# Patient Record
Sex: Male | Born: 1965 | Race: White | Hispanic: No | Marital: Married | Smoking: Never smoker
Health system: Southern US, Community
[De-identification: ages and names within clinical notes are randomized; demographics above are authoritative.]

## PROBLEM LIST (undated history)

## (undated) DIAGNOSIS — K219 Gastro-esophageal reflux disease without esophagitis: Secondary | ICD-10-CM

## (undated) DIAGNOSIS — G4730 Sleep apnea, unspecified: Secondary | ICD-10-CM

## (undated) DIAGNOSIS — B019 Varicella without complication: Secondary | ICD-10-CM

## (undated) HISTORY — DX: Varicella without complication: B01.9

## (undated) HISTORY — PX: FOOT SURGERY: SHX648

## (undated) HISTORY — DX: Sleep apnea, unspecified: G47.30

## (undated) HISTORY — DX: Gastro-esophageal reflux disease without esophagitis: K21.9

---
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
## 2004-05-18 ENCOUNTER — Ambulatory Visit: Payer: Self-pay | Admitting: Podiatry

## 2004-08-22 ENCOUNTER — Emergency Department: Payer: Self-pay | Admitting: Unknown Physician Specialty

## 2008-01-31 ENCOUNTER — Ambulatory Visit: Payer: Self-pay | Admitting: Cardiology

## 2009-03-02 ENCOUNTER — Emergency Department: Payer: Self-pay

## 2013-06-20 ENCOUNTER — Emergency Department: Payer: Self-pay | Admitting: Emergency Medicine

## 2014-11-27 ENCOUNTER — Encounter: Payer: Self-pay | Admitting: Podiatry

## 2014-11-27 ENCOUNTER — Ambulatory Visit: Payer: Self-pay

## 2014-11-27 ENCOUNTER — Ambulatory Visit (INDEPENDENT_AMBULATORY_CARE_PROVIDER_SITE_OTHER): Payer: 59 | Admitting: Podiatry

## 2014-11-27 VITALS — BP 122/75 | HR 72 | Resp 17 | Ht 70.0 in | Wt 225.0 lb

## 2014-11-27 DIAGNOSIS — M7661 Achilles tendinitis, right leg: Secondary | ICD-10-CM

## 2014-11-27 DIAGNOSIS — M7740 Metatarsalgia, unspecified foot: Secondary | ICD-10-CM

## 2014-11-27 DIAGNOSIS — M79672 Pain in left foot: Secondary | ICD-10-CM

## 2014-11-27 DIAGNOSIS — M7662 Achilles tendinitis, left leg: Secondary | ICD-10-CM

## 2014-11-27 MED ORDER — MELOXICAM 15 MG PO TABS: 15.0000 mg | ORAL_TABLET | Freq: Every day | ORAL | Status: DC

## 2014-11-27 NOTE — Patient Instructions (Signed)
Achilles Tendinitis   with Rehab  Achilles tendinitis is a disorder of the Achilles tendon. The Achilles tendon connects the large calf muscles (Gastrocnemius and Soleus) to the heel bone (calcaneus). This tendon is sometimes called the heel cord. It is important for pushing-off and standing on your toes and is important for walking, running, or jumping. Tendinitis is often caused by overuse and repetitive microtrauma.  SYMPTOMS  · Pain, tenderness, swelling, warmth, and redness may occur over the Achilles tendon even at rest.  · Pain with pushing off, or flexing or extending the ankle.  · Pain that is worsened after or during activity.  CAUSES   · Overuse sometimes seen with rapid increase in exercise programs or in sports requiring running and jumping.  · Poor physical conditioning (strength and flexibility or endurance).  · Running sports, especially training running down hills.  · Inadequate warm-up before practice or play or failure to stretch before participation.  · Injury to the tendon.  PREVENTION   · Warm up and stretch before practice or competition.  · Allow time for adequate rest and recovery between practices and competition.  · Keep up conditioning.  ¨ Keep up ankle and leg flexibility.  ¨ Improve or keep muscle strength and endurance.  ¨ Improve cardiovascular fitness.  · Use proper technique.  · Use proper equipment (shoes, skates).  · To help prevent recurrence, taping, protective strapping, or an adhesive bandage may be recommended for several weeks after healing is complete.  PROGNOSIS   · Recovery may take weeks to several months to heal.  · Longer recovery is expected if symptoms have been prolonged.  · Recovery is usually quicker if the inflammation is due to a direct blow as compared with overuse or sudden strain.  RELATED COMPLICATIONS   · Healing time will be prolonged if the condition is not correctly treated. The injury must be given plenty of time to heal.  · Symptoms can reoccur if  activity is resumed too soon.  · Untreated, tendinitis may increase the risk of tendon rupture requiring additional time for recovery and possibly surgery.  TREATMENT   · The first treatment consists of rest anti-inflammatory medication, and ice to relieve the pain.  · Stretching and strengthening exercises after resolution of pain will likely help reduce the risk of recurrence. Referral to a physical therapist or athletic trainer for further evaluation and treatment may be helpful.  · A walking boot or cast may be recommended to rest the Achilles tendon. This can help break the cycle of inflammation and microtrauma.  · Arch supports (orthotics) may be prescribed or recommended by your caregiver as an adjunct to therapy and rest.  · Surgery to remove the inflamed tendon lining or degenerated tendon tissue is rarely necessary and has shown less than predictable results.  MEDICATION   · Nonsteroidal anti-inflammatory medications, such as aspirin and ibuprofen, may be used for pain and inflammation relief. Do not take within 7 days before surgery. Take these as directed by your caregiver. Contact your caregiver immediately if any bleeding, stomach upset, or signs of allergic reaction occur. Other minor pain relievers, such as acetaminophen, may also be used.  · Pain relievers may be prescribed as necessary by your caregiver. Do not take prescription pain medication for longer than 4 to 7 days. Use only as directed and only as much as you need.  · Cortisone injections are rarely indicated. Cortisone injections may weaken tendons and predispose to rupture. It is better   to give the condition more time to heal than to use them.  HEAT AND COLD  · Cold is used to relieve pain and reduce inflammation for acute and chronic Achilles tendinitis. Cold should be applied for 10 to 15 minutes every 2 to 3 hours for inflammation and pain and immediately after any activity that aggravates your symptoms. Use ice packs or an ice  massage.  · Heat may be used before performing stretching and strengthening activities prescribed by your caregiver. Use a heat pack or a warm soak.  SEEK MEDICAL CARE IF:  · Symptoms get worse or do not improve in 2 weeks despite treatment.  · New, unexplained symptoms develop. Drugs used in treatment may produce side effects.  EXERCISES  RANGE OF MOTION (ROM) AND STRETCHING EXERCISES - Achilles Tendinitis   These exercises may help you when beginning to rehabilitate your injury. Your symptoms may resolve with or without further involvement from your physician, physical therapist or athletic trainer. While completing these exercises, remember:   · Restoring tissue flexibility helps normal motion to return to the joints. This allows healthier, less painful movement and activity.  · An effective stretch should be held for at least 30 seconds.  · A stretch should never be painful. You should only feel a gentle lengthening or release in the stretched tissue.  STRETCH - Gastroc, Standing   · Place hands on wall.  · Extend right / left leg, keeping the front knee somewhat bent.  · Slightly point your toes inward on your back foot.  · Keeping your right / left heel on the floor and your knee straight, shift your weight toward the wall, not allowing your back to arch.  · You should feel a gentle stretch in the right / left calf. Hold this position for __________ seconds.  Repeat __________ times. Complete this stretch __________ times per day.  STRETCH - Soleus, Standing   · Place hands on wall.  · Extend right / left leg, keeping the other knee somewhat bent.  · Slightly point your toes inward on your back foot.  · Keep your right / left heel on the floor, bend your back knee, and slightly shift your weight over the back leg so that you feel a gentle stretch deep in your back calf.  · Hold this position for __________ seconds.  Repeat __________ times. Complete this stretch __________ times per day.  STRETCH -  Gastrocsoleus, Standing   Note: This exercise can place a lot of stress on your foot and ankle. Please complete this exercise only if specifically instructed by your caregiver.   · Place the ball of your right / left foot on a step, keeping your other foot firmly on the same step.  · Hold on to the wall or a rail for balance.  · Slowly lift your other foot, allowing your body weight to press your heel down over the edge of the step.  · You should feel a stretch in your right / left calf.  · Hold this position for __________ seconds.  · Repeat this exercise with a slight bend in your knee.  Repeat __________ times. Complete this stretch __________ times per day.   STRENGTHENING EXERCISES - Achilles Tendinitis  These exercises may help you when beginning to rehabilitate your injury. They may resolve your symptoms with or without further involvement from your physician, physical therapist or athletic trainer. While completing these exercises, remember:   · Muscles can gain both the endurance   and the strength needed for everyday activities through controlled exercises.  · Complete these exercises as instructed by your physician, physical therapist or athletic trainer. Progress the resistance and repetitions only as guided.  · You may experience muscle soreness or fatigue, but the pain or discomfort you are trying to eliminate should never worsen during these exercises. If this pain does worsen, stop and make certain you are following the directions exactly. If the pain is still present after adjustments, discontinue the exercise until you can discuss the trouble with your clinician.  STRENGTH - Plantar-flexors   · Sit with your right / left leg extended. Holding onto both ends of a rubber exercise band/tubing, loop it around the ball of your foot. Keep a slight tension in the band.  · Slowly push your toes away from you, pointing them downward.  · Hold this position for __________ seconds. Return slowly, controlling the  tension in the band/tubing.  Repeat __________ times. Complete this exercise __________ times per day.   STRENGTH - Plantar-flexors   · Stand with your feet shoulder width apart. Steady yourself with a wall or table using as little support as needed.  · Keeping your weight evenly spread over the width of your feet, rise up on your toes.*  · Hold this position for __________ seconds.  Repeat __________ times. Complete this exercise __________ times per day.   *If this is too easy, shift your weight toward your right / left leg until you feel challenged. Ultimately, you may be asked to do this exercise with your right / left foot only.  STRENGTH - Plantar-flexors, Eccentric   Note: This exercise can place a lot of stress on your foot and ankle. Please complete this exercise only if specifically instructed by your caregiver.   · Place the balls of your feet on a step. With your hands, use only enough support from a wall or rail to keep your balance.  · Keep your knees straight and rise up on your toes.  · Slowly shift your weight entirely to your right / left toes and pick up your opposite foot. Gently and with controlled movement, lower your weight through your right / left foot so that your heel drops below the level of the step. You will feel a slight stretch in the back of your calf at the end position.  · Use the healthy leg to help rise up onto the balls of both feet, then lower weight only on the right / left leg again. Build up to 15 repetitions. Then progress to 3 consecutive sets of 15 repetitions.*  · After completing the above exercise, complete the same exercise with a slight knee bend (about 30 degrees). Again, build up to 15 repetitions. Then progress to 3 consecutive sets of 15 repetitions.*  Perform this exercise __________ times per day.   *When you easily complete 3 sets of 15, your physician, physical therapist or athletic trainer may advise you to add resistance by wearing a backpack filled with  additional weight.  STRENGTH - Plantar Flexors, Seated   · Sit on a chair that allows your feet to rest flat on the ground. If necessary, sit at the edge of the chair.  · Keeping your toes firmly on the ground, lift your right / left heel as far as you can without increasing any discomfort in your ankle.  Repeat __________ times. Complete this exercise __________ times a day.  *If instructed by your physician, physical therapist or athletic   trainer, you may add ____________________ of resistance by placing a weighted object on your right / left knee.  Document Released: 12/22/2004 Document Revised: 08/15/2011 Document Reviewed: 09/04/2008  [employer]® Patient Information ©2015 [employer]. This information is not intended to replace advice given to you by your health care provider. Make sure you discuss any questions you have with your health care provider.

## 2014-11-27 NOTE — Progress Notes (Signed)
   Subjective:    Patient ID: Luis Perez, male    DOB: [birth year], 49 y.o.   MRN: [id number]  HPI 49 year old male presents the office today with complaints of bilateral heel pain with a left greater than right. He states the majority of his pain in the back of his heel. He also states that he has painful bulbous foot on the left side. He states that both these issues have been ongoing for several months. He denies any history of injury or trauma. He denies any tingling or numbness. Denies any swelling or redness. The pain does not wake him at night. He states that he has stiffness in the back of his heel particularly in the morning which gets better with ambulation. He has had no prior treatment. No other complaints at this time.   Review of Systems  All other systems reviewed and are negative.      Objective:   Physical Exam AAO 3, NAD  DP/PT pulses palpable, CRT less than 3 seconds Protective sensation intact with Simms Weinstein monofilament, vibratory sensation intact, Achilles tendon reflex intact. There is tenderness palpation along the posterior aspect of bilateral calcaneus mostly overlying the lateral aspect over the area of the retrocalcaneal exostosis. There is also tenderness along the distal aspect of the Achilles tendon on the insertion of the calcaneus. There is no tenderness on the mid substance of the Achilles tendon there is no defect noted. There is no overlying edema, erythema, increase in warmth. [place] Silos test is performed is negative. There is no tenderness on the plantar aspect of the calcaneus along the course of the plantar fascia. No pain with lateral compression the calcaneus or pain with vibratory sensation. Equinus is present. There is mild hyperkeratotic lesions bilateral submetatarsal 2 through 4 bilaterally with mild tenderness along this area. There is no areas of pinpoint bony tenderness or pain the vibratory sensation along the metatarsals or digits. There  is no pain with MTPJ range of motion. No other areas of tenderness bilateral lower extremities there is no other areas of edema, erythema, increase in warmth. MMT 5/5, ROM WNL No open lesions or pre-ulcer lesions identified bilaterally.  No pain on calf compression, swelling, warmth, erythema.      Assessment & Plan:  3 shell male bilateral symptomatically retrocalcaneal exostosis, Achilles tendinitis; metatarsalgia -X-rays were obtained and reviewed with the patient.  -Treatment options discussed including all alternatives, risks, and complications -Discussed etiology of symptoms.  -Stretching exercises to continue on a consistent basis. -Ice to the area. -Dispensed night splint.  -Prescribed mobic. Discussed side effects of the medication and directed to stop if any are to occur and call the office.  -Dispensed metatarsal offloading pads. -Follow-up in 4 weeks or sooner if any problems are to arise. In the meantime I encouraged him to call the office with any questions, concerns, change in symptoms.

## 2014-12-25 ENCOUNTER — Ambulatory Visit (INDEPENDENT_AMBULATORY_CARE_PROVIDER_SITE_OTHER): Payer: 59 | Admitting: Podiatry

## 2014-12-25 VITALS — BP 122/78 | HR 77 | Resp 16

## 2014-12-25 DIAGNOSIS — M2570 Osteophyte, unspecified joint: Secondary | ICD-10-CM

## 2014-12-25 DIAGNOSIS — M7661 Achilles tendinitis, right leg: Secondary | ICD-10-CM

## 2014-12-25 DIAGNOSIS — M898X7 Other specified disorders of bone, ankle and foot: Secondary | ICD-10-CM

## 2014-12-25 DIAGNOSIS — M7662 Achilles tendinitis, left leg: Secondary | ICD-10-CM | POA: Diagnosis not present

## 2014-12-26 ENCOUNTER — Encounter: Payer: Self-pay | Admitting: Podiatry

## 2014-12-26 NOTE — Progress Notes (Signed)
Patient ID: Luis Perez, male   DOB: [birth year], 49 y.o.   MRN: [id number]  Subjective: Luis Perez presents today for follow-up of bilateral achilles tendonitis and symptomatic retrocalcaneal exostosis with the left worse than the right. He continues with icing and stretching daily as well as the night splint. He states the pain is about the same without much noticeable improvement. He has also continued with the mobic without any side effects. Pain is worse in the morning or after being up for some time. No numbness or tingling. No recent injury/trauma.  Denies any systemic complaints such as fevers, chills, nausea, vomiting. No acute changes since last appointment, and no other complaints at this time.   Objective: AAO x3, NAD DP/PT pulses palpable bilaterally, CRT less than 3 seconds Protective sensation intact with Simms Weinstein monofilament, vibratory sensation intact, Achilles tendon reflex intact Tenderness along the posterior aspect of bilateral calcaneous over prominent retrocalcaneal exostosis and at the insertion of the achilles tendon. No pain along the midsubstance of the tendon; no defect; negative Thompson test. No pain along the course/insertion of the plantar fascia. No pain with lateral compression of the calcaneous.  No areas of pinpoint bony tenderness or pain with vibratory sensation. MMT 5/5, ROM WNL. No edema, erythema, increase in warmth to bilateral lower extremities.  No open lesions or pre-ulcerative lesions.  No pain with calf compression, swelling, warmth, erythema  Assessment: Achilles tendonitis/retrocalcaneal exostosis   Plan: -All treatment options discussed with the patient including all alternatives, risks, complications.  -Continue with home exercises; night splint; meloxicam. -Rx physical therapy -Discussed surgery if needed -Sheogear modifications/orthtoics  -Follow-up after PT or sooner if any problems arise. In the meantime, encouraged to call the  office with any questions, concerns, change in symptoms.  -Patient encouraged to call the office with any questions, concerns, change in symptoms.   Celesta Gentile, DPM

## 2014-12-31 ENCOUNTER — Encounter: Payer: Self-pay | Admitting: Internal Medicine

## 2014-12-31 ENCOUNTER — Encounter (INDEPENDENT_AMBULATORY_CARE_PROVIDER_SITE_OTHER): Payer: Self-pay

## 2014-12-31 ENCOUNTER — Ambulatory Visit (INDEPENDENT_AMBULATORY_CARE_PROVIDER_SITE_OTHER): Payer: 59 | Admitting: Internal Medicine

## 2014-12-31 VITALS — BP 130/80 | HR 75 | Temp 98.5°F | Ht 69.5 in | Wt 229.0 lb

## 2014-12-31 DIAGNOSIS — M779 Enthesopathy, unspecified: Secondary | ICD-10-CM | POA: Diagnosis not present

## 2014-12-31 DIAGNOSIS — M7750 Other enthesopathy of unspecified foot: Secondary | ICD-10-CM

## 2014-12-31 LAB — LIPID PANEL
Cholesterol: 202 mg/dL — AB (ref 0–200)
HDL: 54 mg/dL (ref 35–70)
LDL CALC: 117 mg/dL
TRIGLYCERIDES: 157 mg/dL (ref 40–160)

## 2014-12-31 LAB — HEMOGLOBIN A1C: Hgb A1c MFr Bld: 5.3 % (ref 4.0–6.0)

## 2014-12-31 NOTE — Assessment & Plan Note (Signed)
Meloxicam not effective We will see if PT helps If not, he will follow up with [facility] to discuss further treatment

## 2014-12-31 NOTE — Patient Instructions (Signed)

## 2014-12-31 NOTE — Progress Notes (Signed)
HPI  Pt presents to the clinic today to establish care and for management of the conditions listed below. He has not had a PCP in many years. He has no concerns today.  Flu: 03/2014 Tetanus: > 10 years ago Vision: yearly Dentist: biannually  He is having bilateral foot pain.This has been going on for 2-3 months. He is seeing [facility]. He has been diagnosed with bilateral heel spurs. He is taking Meloxicam and starting PT next week. He has not noticed a difference in his heel pain.  Past Medical History  Diagnosis Date  . Chicken pox     Current Outpatient Prescriptions  Medication Sig Dispense Refill  . meloxicam (MOBIC) 15 MG tablet Take 1 tablet (15 mg total) by mouth daily. 30 tablet 2   No current facility-administered medications for this visit.    Allergies  Allergen Reactions  . Allegra-D [Fexofenadine-Pseudoephed Er] Swelling    Swelling of the eyes    Family History  Problem Relation Age of Onset  . Arthritis Mother   . Arthritis Maternal Grandfather   . Cancer Neg Hx   . Diabetes Neg Hx   . Heart disease Neg Hx   . Stroke Neg Hx     History   Social History  . Marital Status: Married    Spouse Name: N/A  . Number of Children: N/A  . Years of Education: N/A   Occupational History  . Not on file.   Social History Main Topics  . Smoking status: Never Smoker   . Smokeless tobacco: Never Used  . Alcohol Use: 10.8 oz/week    0 Standard drinks or equivalent, 18 Cans of beer per week     Comment: moderate  . Drug Use: No  . Sexual Activity: Yes   Other Topics Concern  . Not on file   Social History Narrative    ROS:  Constitutional: Denies fever, malaise, fatigue, headache or abrupt weight changes.  Respiratory: Denies difficulty breathing, shortness of breath, cough or sputum production.   Cardiovascular: Denies chest pain, chest tightness, palpitations or swelling in the hands or feet.  Musculoskeletal: Pt reports bilateral heel pain.  Denies decrease in range of motion, difficulty with gait, muscle pain or joint pain and swelling.  Neurological: Denies dizziness, difficulty with memory, difficulty with speech or problems with balance and coordination.   No other specific complaints in a complete review of systems (except as listed in HPI above).  PE:  BP 130/80 mmHg  Pulse 75  Temp(Src) 98.5 F (36.9 C) (Oral)  Ht 5' 9.5" (1.765 m)  Wt 229 lb (103.874 kg)  BMI 33.34 kg/m2  SpO2 98% Wt Readings from Last 3 Encounters:  12/31/14 229 lb (103.874 kg)  11/27/14 225 lb (102.059 kg)    General: Appears his stated age, obese in NAD. Skin: Warm, dry and intact. Cardiovascular: Normal rate and rhythm. S1,S2 noted.  No murmur, rubs or gallops noted.  Pulmonary/Chest: Normal effort and positive vesicular breath sounds. No respiratory distress. No wheezes, rales or ronchi noted.  Musculoskeletal: Normal gait. Pain with palpation of bilateral heels. Neurological: Alert and oriented. Sensation intact to BLE.   Assessment and Plan:

## 2014-12-31 NOTE — Progress Notes (Signed)
Pre visit review using our clinic review tool, if applicable. No additional management support is needed unless otherwise documented below in the visit note. 

## 2015-02-13 ENCOUNTER — Encounter: Payer: Self-pay | Admitting: Internal Medicine

## 2016-02-10 ENCOUNTER — Encounter: Payer: Self-pay | Admitting: Emergency Medicine

## 2016-02-10 ENCOUNTER — Emergency Department: Payer: 59

## 2016-02-10 ENCOUNTER — Emergency Department
Admission: EM | Admit: 2016-02-10 | Discharge: 2016-02-10 | Disposition: A | Discharge status: Home / Self Care | Payer: 59 | Attending: Emergency Medicine | Admitting: Emergency Medicine

## 2016-02-10 DIAGNOSIS — Y929 Unspecified place or not applicable: Secondary | ICD-10-CM | POA: Diagnosis not present

## 2016-02-10 DIAGNOSIS — R109 Unspecified abdominal pain: Secondary | ICD-10-CM

## 2016-02-10 DIAGNOSIS — X58XXXA Exposure to other specified factors, initial encounter: Secondary | ICD-10-CM | POA: Insufficient documentation

## 2016-02-10 DIAGNOSIS — Y9301 Activity, walking, marching and hiking: Secondary | ICD-10-CM | POA: Diagnosis not present

## 2016-02-10 DIAGNOSIS — S39012A Strain of muscle, fascia and tendon of lower back, initial encounter: Secondary | ICD-10-CM | POA: Diagnosis not present

## 2016-02-10 DIAGNOSIS — S3992XA Unspecified injury of lower back, initial encounter: Secondary | ICD-10-CM | POA: Diagnosis present

## 2016-02-10 DIAGNOSIS — Y999 Unspecified external cause status: Secondary | ICD-10-CM | POA: Insufficient documentation

## 2016-02-10 DIAGNOSIS — Z791 Long term (current) use of non-steroidal anti-inflammatories (NSAID): Secondary | ICD-10-CM | POA: Diagnosis not present

## 2016-02-10 DIAGNOSIS — R1031 Right lower quadrant pain: Secondary | ICD-10-CM | POA: Diagnosis not present

## 2016-02-10 LAB — URINALYSIS COMPLETE WITH MICROSCOPIC (ARMC ONLY)
BACTERIA UA: NONE SEEN
BILIRUBIN URINE: NEGATIVE
Glucose, UA: NEGATIVE mg/dL
Hgb urine dipstick: NEGATIVE
Ketones, ur: NEGATIVE mg/dL
Leukocytes, UA: NEGATIVE
Nitrite: NEGATIVE
PH: 6 (ref 5.0–8.0)
Protein, ur: NEGATIVE mg/dL
SQUAMOUS EPITHELIAL / LPF: NONE SEEN
Specific Gravity, Urine: 1.008 (ref 1.005–1.030)

## 2016-02-10 MED ORDER — KETOROLAC TROMETHAMINE 60 MG/2ML IM SOLN
15.0000 mg | Freq: Once | INTRAMUSCULAR | Status: AC
  Administered 2016-02-10: 15 mg via INTRAMUSCULAR
  Filled 2016-02-10: qty 2

## 2016-02-10 MED ORDER — DIAZEPAM 5 MG PO TABS: 5.0000 mg | ORAL_TABLET | Freq: Three times a day (TID) | ORAL | 0 refills | Status: DC | PRN

## 2016-02-10 MED ORDER — NAPROXEN 500 MG PO TABS: 500.0000 mg | ORAL_TABLET | Freq: Two times a day (BID) | ORAL | 0 refills | Status: DC

## 2016-02-10 NOTE — ED Triage Notes (Signed)
Pt here for right flank pain.  Does report hx of kidney stones.

## 2016-02-10 NOTE — ED Provider Notes (Signed)
[facility] Emergency Department Provider Note  ____________________________________________  Time seen: Approximately 6:51 PM  I have reviewed the triage vital signs and the nursing notes.   HISTORY  Chief Complaint Flank Pain    HPI Luis Perez is a 50 y.o. male who complains of right flank pain for the past 3 days, started suddenly while walking. No slips trips or falls. Nonradiating. No fevers chills or sweats. No vomiting or diarrhea. Eating and drinking normally although decreased appetite with this pain. Pain is been constant, worsening. Worse with movement.     Past Medical History:  Diagnosis Date  . Chicken pox   Kidney stones   Patient Active Problem List   Diagnosis Date Noted  . Bone spur of foot 12/31/2014     Past Surgical History:  Procedure Laterality Date  . TONSILLECTOMY AND ADENOIDECTOMY  1985     Prior to Admission medications   Medication Sig Start Date End Date Taking? Authorizing Provider  diazepam (VALIUM) 5 MG tablet Take 1 tablet (5 mg total) by mouth every 8 (eight) hours as needed for muscle spasms. 02/10/16   Carrie Mew, MD  meloxicam (MOBIC) 15 MG tablet Take 1 tablet (15 mg total) by mouth daily. 11/27/14   Trula Slade, DPM  naproxen (NAPROSYN) 500 MG tablet Take 1 tablet (500 mg total) by mouth 2 (two) times daily with a meal. 02/10/16   Carrie Mew, MD     Allergies Allegra-d [fexofenadine-pseudoephed er]   Family History  Problem Relation Age of Onset  . Arthritis Mother   . Arthritis Maternal Grandfather   . Cancer Neg Hx   . Diabetes Neg Hx   . Heart disease Neg Hx   . Stroke Neg Hx     Social History Social History  Substance Use Topics  . Smoking status: Never Smoker  . Smokeless tobacco: Never Used  . Alcohol use 10.8 oz/week    18 Cans of beer per week     Comment: moderate    Review of Systems  Constitutional:   No fever or chills.  ENT:   No sore throat. No  rhinorrhea. Cardiovascular:   No chest pain. Respiratory:   No dyspnea or cough. Gastrointestinal:   Right flank pain without vomiting and diarrhea.  Genitourinary:   Negative for dysuria or difficulty urinating. Musculoskeletal:   Negative for focal pain or swelling Neurological:   Negative for headaches 10-point ROS otherwise negative.  ____________________________________________   PHYSICAL EXAM:  VITAL SIGNS: ED Triage Vitals  Enc Vitals Group     BP 02/10/16 1715 (!) 155/98     Pulse Rate 02/10/16 1715 67     Resp 02/10/16 1715 18     Temp 02/10/16 1715 98.4 F (36.9 C)     Temp Source 02/10/16 1715 Oral     SpO2 02/10/16 1715 97 %     Weight 02/10/16 1715 225 lb (102.1 kg)     Height 02/10/16 1715 5\' 10"  (1.778 m)     Head Circumference --      Peak Flow --      Pain Score 02/10/16 1716 8     Pain Loc --      Pain Edu? --      Excl. in [place]? --     Vital signs reviewed, nursing assessments reviewed.   Constitutional:   Alert and oriented. Well appearing and in no distress. Eyes:   No scleral icterus. No conjunctival pallor. PERRL. EOMI.  No  nystagmus. ENT   Head:   Normocephalic and atraumatic.   Nose:   No congestion/rhinnorhea. No septal hematoma   Mouth/Throat:   MMM, no pharyngeal erythema. No peritonsillar mass.    Neck:   No stridor. No SubQ emphysema. No meningismus. Hematological/Lymphatic/Immunilogical:   No cervical lymphadenopathy. Cardiovascular:   RRR. Symmetric bilateral radial and DP pulses.  No murmurs.  Respiratory:   Normal respiratory effort without tachypnea nor retractions. Breath sounds are clear and equal bilaterally. No wheezes/rales/rhonchi. Gastrointestinal:   Soft With mild right lower quadrant tenderness. Non distended. There is no CVA tenderness.  No rebound, rigidity, or guarding. Pain is aggravated by changing position, moving to upright and supine positions. Genitourinary:   deferred Musculoskeletal:   Nontender with  normal range of motion in all extremities. No joint effusions.  No lower extremity tenderness.  No edema. Neurologic:   Normal speech and language.  CN 2-10 normal. Motor grossly intact. No gross focal neurologic deficits are appreciated.  Skin:    Skin is warm, dry and intact. No rash noted.  No petechiae, purpura, or bullae.  ____________________________________________    LABS (pertinent positives/negatives) (all labs ordered are listed, but only abnormal results are displayed) Labs Reviewed  URINALYSIS COMPLETEWITH MICROSCOPIC ([place]) - Abnormal; Notable for the following:       Result Value   Color, Urine STRAW (*)    APPearance CLEAR (*)    All other components within normal limits   ____________________________________________   EKG    ____________________________________________    RADIOLOGY  CT abdomen and pelvis unremarkable  ____________________________________________   PROCEDURES Procedures  ____________________________________________   INITIAL IMPRESSION / ASSESSMENT AND PLAN / ED COURSE  Pertinent labs & imaging results that were available during my care of the patient were reviewed by me and considered in my medical decision making (see chart for details).  Patient well appearing no acute distress.Considering the patient's symptoms, medical history, and physical examination today, I have low suspicion for cholecystitis or biliary pathology, pancreatitis, perforation or bowel obstruction, hernia, intra-abdominal abscess, AAA or dissection, volvulus or intussusception, mesenteric ischemia, or appendicitis.  His symptoms raise suspicion for possible renal colic versus appendicitis although the patient's very well-appearing afebrile with overall benign exam. Because he has some very slight right lower quadrant tenderness after 3 days of symptoms, we'll get a CT scan of the abdomen pelvis to evaluate for kidney stone versus possible periappendiceal  inflammation. Intramuscular Toradol for pain..     Clinical Course    ----------------------------------------- 7:22 PM on 02/10/2016 -----------------------------------------  Patient remains well appearing. Workup negative. We'll discharge home, treat for muscle strain and spasm with naproxen and Valium. Follow up with primary care. Counseled on possible sedating side effects of the Valium ____________________________________________   FINAL CLINICAL IMPRESSION(S) / ED DIAGNOSES  Final diagnoses:  Flank pain  Low back strain, initial encounter       Portions of this note were generated with dragon dictation software. Dictation errors may occur despite best attempts at proofreading.    Carrie Mew, MD 02/10/16 [phone]

## 2016-02-10 NOTE — ED Notes (Signed)

## 2016-09-22 ENCOUNTER — Encounter: Payer: Self-pay | Admitting: Internal Medicine

## 2016-09-22 ENCOUNTER — Ambulatory Visit (INDEPENDENT_AMBULATORY_CARE_PROVIDER_SITE_OTHER): Payer: 59 | Admitting: Internal Medicine

## 2016-09-22 ENCOUNTER — Encounter (INDEPENDENT_AMBULATORY_CARE_PROVIDER_SITE_OTHER): Payer: Self-pay

## 2016-09-22 VITALS — BP 118/78 | HR 71 | Temp 98.0°F | Ht 69.5 in | Wt 238.5 lb

## 2016-09-22 DIAGNOSIS — K219 Gastro-esophageal reflux disease without esophagitis: Secondary | ICD-10-CM | POA: Diagnosis not present

## 2016-09-22 DIAGNOSIS — Z114 Encounter for screening for human immunodeficiency virus [HIV]: Secondary | ICD-10-CM

## 2016-09-22 DIAGNOSIS — Z0001 Encounter for general adult medical examination with abnormal findings: Secondary | ICD-10-CM

## 2016-09-22 NOTE — Patient Instructions (Signed)
 Health Maintenance, Male A healthy lifestyle and preventive care is important for your health and wellness. Ask your health care provider about what schedule of regular examinations is right for you. What should I know about weight and diet?  Eat a Healthy Diet  Eat plenty of vegetables, fruits, whole grains, low-fat dairy products, and lean protein.  Do not eat a lot of foods high in solid fats, added sugars, or salt. Maintain a Healthy Weight  Regular exercise can help you achieve or maintain a healthy weight. You should:  Do at least 150 minutes of exercise each week. The exercise should increase your heart rate and make you sweat (moderate-intensity exercise).  Do strength-training exercises at least twice a week. Watch Your Levels of Cholesterol and Blood Lipids  Have your blood tested for lipids and cholesterol every 5 years starting at 49 years of age. If you are at high risk for heart disease, you should start having your blood tested when you are 49 years old. You may need to have your cholesterol levels checked more often if:  Your lipid or cholesterol levels are high.  You are older than 50 years of age.  You are at high risk for heart disease. What should I know about cancer screening? Many types of cancers can be detected early and may often be prevented. Lung Cancer  You should be screened every year for lung cancer if:  You are a current smoker who has smoked for at least 30 years.  You are a former smoker who has quit within the past 15 years.  Talk to your health care provider about your screening options, when you should start screening, and how often you should be screened. Colorectal Cancer  Routine colorectal cancer screening usually begins at 50 years of age and should be repeated every 5-10 years until you are 49 years old. You may need to be screened more often if early forms of precancerous polyps or small growths are found. Your health care provider  may recommend screening at an earlier age if you have risk factors for colon cancer.  Your health care provider may recommend using home test kits to check for hidden blood in the stool.  A small camera at the end of a tube can be used to examine your colon (sigmoidoscopy or colonoscopy). This checks for the earliest forms of colorectal cancer. Prostate and Testicular Cancer  Depending on your age and overall health, your health care provider may do certain tests to screen for prostate and testicular cancer.  Talk to your health care provider about any symptoms or concerns you have about testicular or prostate cancer. Skin Cancer  Check your skin from head to toe regularly.  Tell your health care provider about any new moles or changes in moles, especially if:  There is a change in a mole's size, shape, or color.  You have a mole that is larger than a pencil eraser.  Always use sunscreen. Apply sunscreen liberally and repeat throughout the day.  Protect yourself by wearing long sleeves, pants, a wide-brimmed hat, and sunglasses when outside. What should I know about heart disease, diabetes, and high blood pressure?  If you are 18-39 years of age, have your blood pressure checked every 3-5 years. If you are 40 years of age or older, have your blood pressure checked every year. You should have your blood pressure measured twice-once when you are at a hospital or clinic, and once when you are not at   a hospital or clinic. Record the average of the two measurements. To check your blood pressure when you are not at a hospital or clinic, you can use:  An automated blood pressure machine at a pharmacy.  A home blood pressure monitor.  Talk to your health care provider about your target blood pressure.  If you are between 45-79 years old, ask your health care provider if you should take aspirin to prevent heart disease.  Have regular diabetes screenings by checking your fasting blood sugar  level.  If you are at a normal weight and have a low risk for diabetes, have this test once every three years after the age of 45.  If you are overweight and have a high risk for diabetes, consider being tested at a younger age or more often.  A one-time screening for abdominal aortic aneurysm (AAA) by ultrasound is recommended for men aged 65-75 years who are current or former smokers. What should I know about preventing infection? Hepatitis B  If you have a higher risk for hepatitis B, you should be screened for this virus. Talk with your health care provider to find out if you are at risk for hepatitis B infection. Hepatitis C  Blood testing is recommended for:  Everyone born from 1945 through 1965.  Anyone with known risk factors for hepatitis C. Sexually Transmitted Diseases (STDs)  You should be screened each year for STDs including gonorrhea and chlamydia if:  You are sexually active and are younger than 49 years of age.  You are older than 49 years of age and your health care provider tells you that you are at risk for this type of infection.  Your sexual activity has changed since you were last screened and you are at an increased risk for chlamydia or gonorrhea. Ask your health care provider if you are at risk.  Talk with your health care provider about whether you are at high risk of being infected with HIV. Your health care provider may recommend a prescription medicine to help prevent HIV infection. What else can I do?  Schedule regular health, dental, and eye exams.  Stay current with your vaccines (immunizations).  Do not use any tobacco products, such as cigarettes, chewing tobacco, and e-cigarettes. If you need help quitting, ask your health care provider.  Limit alcohol intake to no more than 2 drinks per day. One drink equals 12 ounces of beer, 5 ounces of wine, or 1 ounces of hard liquor.  Do not use street drugs.  Do not share needles.  Ask your health  care provider for help if you need support or information about quitting drugs.  Tell your health care provider if you often feel depressed.  Tell your health care provider if you have ever been abused or do not feel safe at home. This information is not intended to replace advice given to you by your health care provider. Make sure you discuss any questions you have with your health care provider. Document Released: 11/19/2007 Document Revised: 01/20/2016 Document Reviewed: 02/24/2015 Elsevier Interactive Patient Education  2017 Elsevier Inc[employer]  

## 2016-09-22 NOTE — Progress Notes (Signed)
Subjective:    Patient ID: Luis Perez, male    DOB: August 01, 1965, 51 y.o.   MRN: [id number]  HPI  Pt presents to the clinic today for his annual exam.  Flu: 03/2016 Tetanus: > 10 years ago Vision Screening: yearly Dentist: biannually  Diet: He does eat meat. He consumes fruits and veggies daily. He does eat some fried foods. He drinks mostly water. 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    Review of Systems  Past Medical History:  Diagnosis Date  . Chicken pox     Current Outpatient Prescriptions  Medication Sig Dispense Refill  . diazepam (VALIUM) 5 MG tablet Take 1 tablet (5 mg total) by mouth every 8 (eight) hours as needed for muscle spasms. 8 tablet 0  . meloxicam (MOBIC) 15 MG tablet Take 1 tablet (15 mg total) by mouth daily. 30 tablet 2  . naproxen (NAPROSYN) 500 MG tablet Take 1 tablet (500 mg total) by mouth 2 (two) times daily with a meal. 20 tablet 0   No current facility-administered medications for this visit.     Allergies  Allergen Reactions  . Allegra-D [Fexofenadine-Pseudoephed Er] Swelling    Swelling of the eyes    Family History  Problem Relation Age of Onset  . Arthritis Mother   . Arthritis Maternal Grandfather   . Cancer Neg Hx   . Diabetes Neg Hx   . Heart disease Neg Hx   . Stroke Neg Hx     Social History   Social History  . Marital status: Married    Spouse name: N/A  . Number of children: N/A  . Years of education: N/A   Occupational History  . Not on file.   Social History Main Topics  . Smoking status: Never Smoker  . Smokeless tobacco: Never Used  . Alcohol use 10.8 oz/week    18 Cans of beer per week     Comment: moderate  . Drug use: No  . Sexual activity: Yes   Other Topics Concern  . Not on file   Social History Narrative  . No narrative on file     Constitutional: Pt reports weight gain. Denies fever, malaise, fatigue, headache.  HEENT: Denies eye pain, eye redness, ear pain, ringing in the ears, wax  buildup, runny nose, nasal congestion, bloody nose, or sore throat. Respiratory: Denies difficulty breathing, shortness of breath, cough or sputum production.   Cardiovascular: Denies chest pain, chest tightness, palpitations or swelling in the hands or feet.  Gastrointestinal: Pt reports reflux. Denies abdominal pain, bloating, constipation, diarrhea or blood in the stool.  GU: Denies urgency, frequency, pain with urination, burning sensation, blood in urine, odor or discharge. Musculoskeletal: Denies decrease in range of motion, difficulty with gait, muscle pain or joint pain and swelling.  Skin: Denies redness, rashes, lesions or ulcercations.  Neurological: Denies dizziness, difficulty with memory, difficulty with speech or problems with balance and coordination.  Psych: Denies anxiety, depression, SI/HI.  No other specific complaints in a complete review of systems (except as listed in HPI above).     Objective:   Physical Exam BP 118/78   Pulse 71   Temp 98 F (36.7 C) (Oral)   Ht 5' 9.5" (1.765 m)   Wt 238 lb 8 oz (108.2 kg)   SpO2 97%   BMI 34.72 kg/m  Wt Readings from Last 3 Encounters:  09/22/16 238 lb 8 oz (108.2 kg)  02/10/16 225 lb (102.1 kg)  12/31/14 229 lb (103.9 kg)  General: Appears his stated age, obese in NAD. Skin: Warm, dry and intact.  HEENT: Head: normal shape and size; Eyes: sclera white, no icterus, conjunctiva pink, PERRLA and EOMs intact; Ears: Tm's gray and intact, normal light reflex; Throat/Mouth: Teeth present, mucosa pink and moist, no exudate, lesions or ulcerations noted.  Neck:  Neck supple, trachea midline. No masses, lumps or thyromegaly present.  Cardiovascular: Normal rate and rhythm. S1,S2 noted.  No murmur, rubs or gallops noted. No JVD or BLE edema. No carotid bruits noted. Pulmonary/Chest: Normal effort and positive vesicular breath sounds. No respiratory distress. No wheezes, rales or ronchi noted.  Abdomen: Soft and nontender. Normal  bowel sounds. Ventral hernia noted. Liver, spleen and kidneys non palpable. Musculoskeletal: Strength 5/5 BUE/BLE. No difficulty with gait.  Neurological: Alert and oriented. Cranial nerves II-XII grossly intact. Coordination normal.  Psychiatric: Mood and affect normal. Behavior is normal. Judgment and thought content normal.    BMET No results found for: NA, K, CL, CO2, GLUCOSE, BUN, CREATININE, CALCIUM, GFRNONAA, GFRAA  Lipid Panel     Component Value Date/Time   CHOL 202 (A) 12/31/2014   TRIG 157 12/31/2014   HDL 54 12/31/2014   LDLCALC 117 12/31/2014    CBC No results found for: WBC, RBC, HGB, HCT, PLT, MCV, MCH, MCHC, RDW, LYMPHSABS, MONOABS, EOSABS, BASOSABS  Hgb A1C Lab Results  Component Value Date   HGBA1C 5.3 12/31/2014             Assessment & Plan:   Preventative Health Maintenance:  Flu shot UTD He declines tetanus Encouraged him to consume a balanced diet and exercise regimen Advised him to see an eye doctor and dentist annually Will check CBC, CMET, Lipid, A1C and HIV today  GERD:   Start Prilosec OTC  RTC in 1 year, sooner if needed Webb Silversmith, NP

## 2016-09-29 LAB — COMPREHENSIVE METABOLIC PANEL
A/G RATIO: 2 (ref 1.2–2.2)
ALK PHOS: 79 IU/L (ref 39–117)
ALT: 36 IU/L (ref 0–44)
AST: 21 IU/L (ref 0–40)
Albumin: 4.7 g/dL (ref 3.5–5.5)
BILIRUBIN TOTAL: 0.4 mg/dL (ref 0.0–1.2)
BUN/Creatinine Ratio: 17 (ref 9–20)
BUN: 16 mg/dL (ref 6–24)
CHLORIDE: 102 mmol/L (ref 96–106)
CO2: 23 mmol/L (ref 18–29)
Calcium: 9.6 mg/dL (ref 8.7–10.2)
Creatinine, Ser: 0.94 mg/dL (ref 0.76–1.27)
GFR calc Af Amer: 110 mL/min/{1.73_m2} (ref >59)
GFR, EST NON AFRICAN AMERICAN: 95 mL/min/{1.73_m2} (ref >59)
GLOBULIN, TOTAL: 2.3 g/dL (ref 1.5–4.5)
Glucose: 75 mg/dL (ref 65–99)
POTASSIUM: 4.2 mmol/L (ref 3.5–5.2)
SODIUM: 143 mmol/L (ref 134–144)
Total Protein: 7 g/dL (ref 6.0–8.5)

## 2016-09-29 LAB — CBC
HEMATOCRIT: 45.6 % (ref 37.5–51.0)
Hemoglobin: 15.5 g/dL (ref 13.0–17.7)
MCH: 31.3 pg (ref 26.6–33.0)
MCHC: 34 g/dL (ref 31.5–35.7)
MCV: 92 fL (ref 79–97)
Platelets: 254 10*3/uL (ref 150–379)
RBC: 4.96 x10E6/uL (ref 4.14–5.80)
RDW: 13.6 % (ref 12.3–15.4)
WBC: 5.1 10*3/uL (ref 3.4–10.8)

## 2016-09-29 LAB — LIPID PANEL
CHOL/HDL RATIO: 4.1 ratio (ref 0.0–5.0)
CHOLESTEROL TOTAL: 207 mg/dL — AB (ref 100–199)
HDL: 50 mg/dL (ref >39)
LDL Calculated: 125 mg/dL — ABNORMAL HIGH (ref 0–99)
TRIGLYCERIDES: 159 mg/dL — AB (ref 0–149)
VLDL Cholesterol Cal: 32 mg/dL (ref 5–40)

## 2016-09-29 LAB — HEMOGLOBIN A1C
ESTIMATED AVERAGE GLUCOSE: 97 mg/dL
Hgb A1c MFr Bld: 5 % (ref 4.8–5.6)

## 2016-09-29 LAB — HIV ANTIBODY (ROUTINE TESTING W REFLEX): HIV SCREEN 4TH GENERATION: NONREACTIVE

## 2017-05-22 ENCOUNTER — Ambulatory Visit: Payer: Self-pay | Admitting: *Deleted

## 2017-05-22 NOTE — Telephone Encounter (Signed)
Requesting med for congestion. Has a cough. No fever.  Appointment made.  Reason for Disposition . Wheezing is present    Pt states he is wheezing a little from a cold and chest congestion.  Answer Assessment - Initial Assessment Questions 1. ONSET: "When did the nasal discharge start?"      yes 2. AMOUNT: "How much discharge is there?"      A little 3. COUGH: "Do you have a cough?" If yes, ask: "Describe the color of your sputum" (clear, white, yellow, green)     Yes, productive at times, clear and sometimes green 4. RESPIRATORY DISTRESS: "Describe your breathing."      Wheezing a little bit 5. FEVER: "Do you have a fever?" If so, ask: "What is your temperature, how was it measured, and when did it start?"     no 6. SEVERITY: "Overall, how bad are you feeling right now?" (e.g., doesn't interfere with normal activities, staying home from school/work, staying in bed)      ok 7. OTHER SYMPTOMS: "Do you have any other symptoms?" (e.g., sore throat, earache, wheezing, vomiting)     wheezing 8. PREGNANCY: "Is there any chance you are pregnant?" "When was your last menstrual period?"     no  Protocols used: [place], COMMON COLD-A-AH

## 2017-05-22 NOTE — Telephone Encounter (Signed)
I spoke with pt and due to his schedule he requested the 05/25/17 at 3 pm appt. If pt condition changes or worsens prior to appt pt will cb.

## 2017-05-25 ENCOUNTER — Ambulatory Visit: Payer: 59 | Admitting: Internal Medicine

## 2017-05-25 ENCOUNTER — Encounter: Payer: Self-pay | Admitting: Internal Medicine

## 2017-05-25 VITALS — BP 138/86 | HR 78 | Temp 98.1°F | Wt 243.0 lb

## 2017-05-25 DIAGNOSIS — B9789 Other viral agents as the cause of diseases classified elsewhere: Secondary | ICD-10-CM | POA: Diagnosis not present

## 2017-05-25 DIAGNOSIS — J069 Acute upper respiratory infection, unspecified: Secondary | ICD-10-CM

## 2017-05-25 NOTE — Progress Notes (Signed)
HPI  Pt presents to the clinic today with c/o runny nose and cough. This started 2 weeks ago. He is blowing clear mucous out of his nose. The cough is productive of clear/yellow mucous. He denies ear pain, sore throat or shortness of breath. He denies fever, chills or body aches. He has tried Mucinex with minimal relief. He did a live MD visit 12/12 and was given a RX for [employer]. He has no history of seasonal allergies. He has not had sick contacts.  Review of Systems      Past Medical History:  Diagnosis Date  . Chicken pox     Family History  Problem Relation Age of Onset  . Arthritis Mother   . Arthritis Maternal Grandfather   . Cancer Neg Hx   . Diabetes Neg Hx   . Heart disease Neg Hx   . Stroke Neg Hx     Social History   Socioeconomic History  . Marital status: Married    Spouse name: Not on file  . Number of children: Not on file  . Years of education: Not on file  . Highest education level: Not on file  Social Needs  . Financial resource strain: Not on file  . Food insecurity - worry: Not on file  . Food insecurity - inability: Not on file  . Transportation needs - medical: Not on file  . Transportation needs - non-medical: Not on file  Occupational History  . Not on file  Tobacco Use  . Smoking status: Never Smoker  . Smokeless tobacco: Never Used  Substance and Sexual Activity  . Alcohol use: Yes    Alcohol/week: 10.8 oz    Types: 18 Cans of beer per week    Comment: moderate  . Drug use: No  . Sexual activity: Yes  Other Topics Concern  . Not on file  Social History Narrative  . Not on file    Allergies  Allergen Reactions  . Allegra-D [Fexofenadine-Pseudoephed Er] Swelling    Swelling of the eyes     Constitutional: Denies headache, fatigue, fever or  abrupt weight changes.  HEENT:  Positive runny nose. Denies eye redness, eye pain, pressure behind the eyes, facial pain, nasal congestion, ear pain, ringing in the ears, wax buildup  or sore throat. Respiratory: Positive cough. Denies difficulty breathing or shortness of breath.  Cardiovascular: Denies chest pain, chest tightness, palpitations or swelling in the hands or feet.   No other specific complaints in a complete review of systems (except as listed in HPI above).  Objective:   BP 138/86   Pulse 78   Temp 98.1 F (36.7 C) (Oral)   Wt 243 lb (110.2 kg)   SpO2 98%   BMI 35.37 kg/m  Wt Readings from Last 3 Encounters:  05/25/17 243 lb (110.2 kg)  09/22/16 238 lb 8 oz (108.2 kg)  02/10/16 225 lb (102.1 kg)     General: Appears his stated age, well developed, well nourished in NAD. HEENT: Head: normal shape and size, no sinus tenderness noted; Ears: Tm's gray and intact, normal light reflex; Nose: mucosa pink and moist, septum midline; Throat/Mouth: + PND. Teeth present, mucosa erythematous and moist, no exudate noted, no lesions or ulcerations noted.  Neck: No cervical lymphadenopathy.  Cardiovascular: Normal rate and rhythm.  Pulmonary/Chest: Normal effort and positive vesicular breath sounds. No respiratory distress. No wheezes, rales or ronchi noted.       Assessment & Plan:   Viral URI with Cough  Get some rest and drink plenty of water Start Zyrtec and Flonase OTC No indication for abx today He declines RX for cough syrup  RTC as needed or if symptoms persist.   Webb Silversmith, NP

## 2017-05-28 ENCOUNTER — Encounter: Payer: Self-pay | Admitting: Internal Medicine

## 2017-05-28 NOTE — Patient Instructions (Signed)
Upper Respiratory Infection, Adult Most upper respiratory infections (URIs) are caused by a virus. A URI affects the nose, throat, and upper air passages. The most common type of URI is often called "the common cold." Follow these instructions at home:  Take medicines only as told by your doctor.  Gargle warm saltwater or take cough drops to comfort your throat as told by your doctor.  Use a warm mist humidifier or inhale steam from a shower to increase air moisture. This may make it easier to breathe.  Drink enough fluid to keep your pee (urine) clear or pale yellow.  Eat soups and other clear broths.  Have a healthy diet.  Rest as needed.  Go back to work when your fever is gone or your doctor says it is okay. ? You may need to stay home longer to avoid giving your URI to others. ? You can also wear a face mask and wash your hands often to prevent spread of the virus.  Use your inhaler more if you have asthma.  Do not use any tobacco products, including cigarettes, chewing tobacco, or electronic cigarettes. If you need help quitting, ask your doctor. Contact a doctor if:  You are getting worse, not better.  Your symptoms are not helped by medicine.  You have chills.  You are getting more short of breath.  You have brown or red mucus.  You have yellow or brown discharge from your nose.  You have pain in your face, especially when you bend forward.  You have a fever.  You have puffy (swollen) neck glands.  You have pain while swallowing.  You have white areas in the back of your throat. Get help right away if:  You have very bad or constant: ? Headache. ? Ear pain. ? Pain in your forehead, behind your eyes, and over your cheekbones (sinus pain). ? Chest pain.  You have long-lasting (chronic) lung disease and any of the following: ? Wheezing. ? Long-lasting cough. ? Coughing up blood. ? A change in your usual mucus.  You have a stiff neck.  You have  changes in your: ? Vision. ? Hearing. ? Thinking. ? Mood. This information is not intended to replace advice given to you by your health care provider. Make sure you discuss any questions you have with your health care provider. Document Released: 11/09/2007 Document Revised: 01/24/2016 Document Reviewed: 08/28/2013 Elsevier Interactive Patient Education  2018 Elsevier Inc.  

## 2017-09-01 ENCOUNTER — Encounter: Payer: Self-pay | Admitting: Internal Medicine

## 2017-09-25 ENCOUNTER — Encounter: Payer: 59 | Admitting: Internal Medicine

## 2018-02-08 ENCOUNTER — Encounter: Payer: Self-pay | Admitting: Internal Medicine

## 2018-02-08 ENCOUNTER — Ambulatory Visit (INDEPENDENT_AMBULATORY_CARE_PROVIDER_SITE_OTHER): Payer: Managed Care, Other (non HMO) | Admitting: Internal Medicine

## 2018-02-08 VITALS — BP 124/82 | HR 65 | Temp 98.0°F | Ht 70.0 in | Wt 246.0 lb

## 2018-02-08 DIAGNOSIS — Z125 Encounter for screening for malignant neoplasm of prostate: Secondary | ICD-10-CM | POA: Diagnosis not present

## 2018-02-08 DIAGNOSIS — Z0000 Encounter for general adult medical examination without abnormal findings: Secondary | ICD-10-CM

## 2018-02-08 DIAGNOSIS — E782 Mixed hyperlipidemia: Secondary | ICD-10-CM | POA: Diagnosis not present

## 2018-02-08 NOTE — Patient Instructions (Signed)

## 2018-02-08 NOTE — Progress Notes (Signed)
Subjective:    Patient ID: Luis Perez, male    DOB: [birth year], 52 y.o.   MRN: [id number]  HPI  Pt presents to the clinic today for his annual exam.  Flu: 03/2017 Tetanus: > 10 years ago PSA Screening: never Colon Screening: never Vision Screening: annually Dentist: biannually  Diet: He does eat meat. He does eat fruits and veggies daily. He tries to avoid fried foods. He drinks mostly coffee, water or soda. Exercise: None  Review of Systems      Past Medical History:  Diagnosis Date  . Chicken pox     Current Outpatient Medications  Medication Sig Dispense Refill  . benzonatate (TESSALON) 100 MG capsule TAKE 1 CAPSULE BY MOUTH 3 TIMES A DAY AS NEEDED FOR COUGH  0   No current facility-administered medications for this visit.     Allergies  Allergen Reactions  . Allegra-D [Fexofenadine-Pseudoephed Er] Swelling    Swelling of the eyes    Family History  Problem Relation Age of Onset  . Arthritis Mother   . Arthritis Maternal Grandfather   . Cancer Neg Hx   . Diabetes Neg Hx   . Heart disease Neg Hx   . Stroke Neg Hx     Social History   Socioeconomic History  . Marital status: Married    Spouse name: Not on file  . Number of children: Not on file  . Years of education: Not on file  . Highest education level: Not on file  Occupational History  . Not on file  Social Needs  . Financial resource strain: Not on file  . Food insecurity:    Worry: Not on file    Inability: Not on file  . Transportation needs:    Medical: Not on file    Non-medical: Not on file  Tobacco Use  . Smoking status: Never Smoker  . Smokeless tobacco: Never Used  Substance and Sexual Activity  . Alcohol use: Yes    Alcohol/week: 18.0 standard drinks    Types: 18 Cans of beer per week    Comment: moderate  . Drug use: No  . Sexual activity: Yes  Lifestyle  . Physical activity:    Days per week: Not on file    Minutes per session: Not on file  . Stress: Not on  file  Relationships  . Social connections:    Talks on phone: Not on file    Gets together: Not on file    Attends religious service: Not on file    Active member of club or organization: Not on file    Attends meetings of clubs or organizations: Not on file    Relationship status: Not on file  . Intimate partner violence:    Fear of current or ex partner: Not on file    Emotionally abused: Not on file    Physically abused: Not on file    Forced sexual activity: Not on file  Other Topics Concern  . Not on file  Social History Narrative  . Not on file     Constitutional: Denies fever, malaise, fatigue, headache or abrupt weight changes.  HEENT: Denies eye pain, eye redness, ear pain, ringing in the ears, wax buildup, runny nose, nasal congestion, bloody nose, or sore throat. Respiratory: Denies difficulty breathing, shortness of breath, cough or sputum production.   Cardiovascular: Denies chest pain, chest tightness, palpitations or swelling in the hands or feet.  Gastrointestinal: Pt reports intermittent reflux. Denies abdominal pain, bloating,  constipation, diarrhea or blood in the stool.  GU: Denies urgency, frequency, pain with urination, burning sensation, blood in urine, odor or discharge. Musculoskeletal: Denies decrease in range of motion, difficulty with gait, muscle pain or joint pain and swelling.  Skin: Denies redness, rashes, lesions or ulcercations.  Neurological: Denies dizziness, difficulty with memory, difficulty with speech or problems with balance and coordination.  Psych: Denies anxiety, depression, SI/HI.  No other specific complaints in a complete review of systems (except as listed in HPI above).  Objective:   Physical Exam   BP 124/82   Pulse 65   Temp 98 F (36.7 C) (Oral)   Ht 5\' 10"  (1.778 m)   Wt 246 lb (111.6 kg)   SpO2 98%   BMI 35.30 kg/m  Wt Readings from Last 3 Encounters:  02/08/18 246 lb (111.6 kg)  05/25/17 243 lb (110.2 kg)    09/22/16 238 lb 8 oz (108.2 kg)    General: Appears his stated age, obese in NAD. Skin: Warm, dry and intact.  HEENT: Head: normal shape and size; Eyes: sclera white, no icterus, conjunctiva pink, PERRLA and EOMs intact; Ears: Tm's gray and intact, normal light reflex; Throat/Mouth: Teeth present, mucosa pink and moist, no exudate, lesions or ulcerations noted.  Neck:  Neck supple, trachea midline. No masses, lumps or thyromegaly present.  Cardiovascular: Normal rate and rhythm. S1,S2 noted.  No murmur, rubs or gallops noted. No JVD or BLE edema. No carotid bruits noted. Pulmonary/Chest: Normal effort and positive vesicular breath sounds. No respiratory distress. No wheezes, rales or ronchi noted.  Abdomen: Soft and nontender. Normal bowel sounds. Ventral hernia noted. Liver, spleen and kidneys non palpable. Musculoskeletal: Strength 5/5 BUE/BLE. No difficulty with gait.  Neurological: Alert and oriented. Cranial nerves II-XII grossly intact. Coordination normal.  Psychiatric: Mood and affect normal. Behavior is normal. Judgment and thought content normal.    BMET    Component Value Date/Time   NA 143 09/22/2016 1509   K 4.2 09/22/2016 1509   CL 102 09/22/2016 1509   CO2 23 09/22/2016 1509   GLUCOSE 75 09/22/2016 1509   BUN 16 09/22/2016 1509   CREATININE 0.94 09/22/2016 1509   CALCIUM 9.6 09/22/2016 1509   GFRNONAA 95 09/22/2016 1509   GFRAA 110 09/22/2016 1509    Lipid Panel     Component Value Date/Time   CHOL 207 (H) 09/22/2016 1509   TRIG 159 (H) 09/22/2016 1509   HDL 50 09/22/2016 1509   CHOLHDL 4.1 09/22/2016 1509   LDLCALC 125 (H) 09/22/2016 1509    CBC    Component Value Date/Time   WBC 5.1 09/22/2016 1509   RBC 4.96 09/22/2016 1509   HGB 15.5 09/22/2016 1509   HCT 45.6 09/22/2016 1509   PLT 254 09/22/2016 1509   MCV 92 09/22/2016 1509   MCH 31.3 09/22/2016 1509   MCHC 34.0 09/22/2016 1509   RDW 13.6 09/22/2016 1509    Hgb A1C Lab Results  Component  Value Date   HGBA1C 5.0 09/22/2016           Assessment & Plan:   Preventative Health Maintenance:  Encouraged him to get a flu shot in the fall He declines tetanus He declines colonoscopy or Cologuard at this time Encouraged him to consume a balanced diet and exercise regimen Advised him to see an eye doctor and dentist Will check CBC, CMET, Lipid and PSA today  RTC in 1 year, sooner if needed Webb Silversmith, NP

## 2018-02-09 LAB — CBC
HEMOGLOBIN: 16.3 g/dL (ref 13.0–17.7)
Hematocrit: 46.8 % (ref 37.5–51.0)
MCH: 32 pg (ref 26.6–33.0)
MCHC: 34.8 g/dL (ref 31.5–35.7)
MCV: 92 fL (ref 79–97)
Platelets: 264 10*3/uL (ref 150–450)
RBC: 5.09 x10E6/uL (ref 4.14–5.80)
RDW: 12.9 % (ref 12.3–15.4)
WBC: 6.1 10*3/uL (ref 3.4–10.8)

## 2018-02-09 LAB — COMPREHENSIVE METABOLIC PANEL
A/G RATIO: 1.8 (ref 1.2–2.2)
ALK PHOS: 85 IU/L (ref 39–117)
ALT: 38 IU/L (ref 0–44)
AST: 25 IU/L (ref 0–40)
Albumin: 4.7 g/dL (ref 3.5–5.5)
BILIRUBIN TOTAL: 0.3 mg/dL (ref 0.0–1.2)
BUN / CREAT RATIO: 14 (ref 9–20)
BUN: 13 mg/dL (ref 6–24)
CHLORIDE: 101 mmol/L (ref 96–106)
CO2: 24 mmol/L (ref 20–29)
Calcium: 9.8 mg/dL (ref 8.7–10.2)
Creatinine, Ser: 0.92 mg/dL (ref 0.76–1.27)
GFR calc Af Amer: 112 mL/min/{1.73_m2} (ref >59)
GFR calc non Af Amer: 97 mL/min/{1.73_m2} (ref >59)
Globulin, Total: 2.6 g/dL (ref 1.5–4.5)
Glucose: 88 mg/dL (ref 65–99)
POTASSIUM: 4.3 mmol/L (ref 3.5–5.2)
SODIUM: 141 mmol/L (ref 134–144)
TOTAL PROTEIN: 7.3 g/dL (ref 6.0–8.5)

## 2018-02-09 LAB — LIPID PANEL
CHOLESTEROL TOTAL: 227 mg/dL — AB (ref 100–199)
Chol/HDL Ratio: 4.3 ratio (ref 0.0–5.0)
HDL: 53 mg/dL (ref >39)
LDL Calculated: 141 mg/dL — ABNORMAL HIGH (ref 0–99)
Triglycerides: 166 mg/dL — ABNORMAL HIGH (ref 0–149)
VLDL Cholesterol Cal: 33 mg/dL (ref 5–40)

## 2018-02-09 LAB — PSA: Prostate Specific Ag, Serum: 0.7 ng/mL (ref 0.0–4.0)

## 2018-02-19 NOTE — Addendum Note (Signed)
Addended by: Lurlean Nanny on: 02/19/2018 10:50 AM   Modules accepted: Orders

## 2018-02-20 ENCOUNTER — Telehealth: Payer: Self-pay | Admitting: Internal Medicine

## 2018-02-20 NOTE — Telephone Encounter (Signed)
Copied from [place] [id number]. Topic: General - Other >> Feb 20, 2018  4:32 PM Cecelia Byars, NT wrote: Reason for CRM: Patient called and said he spoke with [facility] yesterday and there was supposed to have been some cholersterol medication called in to [id number] - [place], [place] [phone] (Phone) [phone] (Fax)  The patient would like once this is done at [phone]

## 2018-02-20 NOTE — Telephone Encounter (Signed)
Left message on voicemail.

## 2018-02-21 NOTE — Telephone Encounter (Signed)
Luis Perez wants pt to try to work on diet and exercise x 3 months and recheck labs, if labs are still abnormal in 3 months start Rx medication.Marland KitchenMarland Kitchen

## 2018-04-10 ENCOUNTER — Telehealth: Payer: Self-pay

## 2018-04-10 DIAGNOSIS — E782 Mixed hyperlipidemia: Secondary | ICD-10-CM

## 2018-04-10 NOTE — Telephone Encounter (Signed)
Pt left v/m; pt is aware supposed to come back in for more labs and pt wants to know if can get a written order so can have labs done at a [facility]. Pt also wants to know if can get referral for a sleep study. Pt request cb from [facility].

## 2018-04-11 NOTE — Telephone Encounter (Signed)
Lipid reordered for [facility] and pt is aware that he will need an office visit for referral to Pulm .... Pt states he will call us back to let us know

## 2018-04-27 ENCOUNTER — Encounter: Payer: Self-pay | Admitting: Internal Medicine

## 2018-04-27 ENCOUNTER — Ambulatory Visit (INDEPENDENT_AMBULATORY_CARE_PROVIDER_SITE_OTHER): Payer: Managed Care, Other (non HMO) | Admitting: Internal Medicine

## 2018-04-27 VITALS — BP 124/86 | HR 78 | Temp 98.4°F | Wt 243.0 lb

## 2018-04-27 DIAGNOSIS — G4719 Other hypersomnia: Secondary | ICD-10-CM | POA: Diagnosis not present

## 2018-04-27 DIAGNOSIS — R0683 Snoring: Secondary | ICD-10-CM

## 2018-04-30 ENCOUNTER — Encounter: Payer: Self-pay | Admitting: Internal Medicine

## 2018-04-30 NOTE — Patient Instructions (Signed)

## 2018-04-30 NOTE — Progress Notes (Signed)
Subjective:    Patient ID: Luis Perez, male    DOB: [birth year], 52 y.o.   MRN: [id number]  HPI  Pt presents to the clinic today requesting referral for sleep study. He reports that he snores at night. His wife has noticed that he has stopped breathing while he sleeps. He wakes up very tired. He is sleepy during the day. He is not able to take naps but if he could he would. He has never had a sleep study in the past. He is obese and reports he has 2 siblings with sleep apnea.  Review of Systems      Past Medical History:  Diagnosis Date  . Chicken pox     No current outpatient medications on file.   No current facility-administered medications for this visit.     Allergies  Allergen Reactions  . Allegra-D [Fexofenadine-Pseudoephed Er] Swelling    Swelling of the eyes    Family History  Problem Relation Age of Onset  . Arthritis Mother   . Arthritis Maternal Grandfather   . Cancer Neg Hx   . Diabetes Neg Hx   . Heart disease Neg Hx   . Stroke Neg Hx     Social History   Socioeconomic History  . Marital status: Married    Spouse name: Not on file  . Number of children: Not on file  . Years of education: Not on file  . Highest education level: Not on file  Occupational History  . Not on file  Social Needs  . Financial resource strain: Not on file  . Food insecurity:    Worry: Not on file    Inability: Not on file  . Transportation needs:    Medical: Not on file    Non-medical: Not on file  Tobacco Use  . Smoking status: Never Smoker  . Smokeless tobacco: Never Used  Substance and Sexual Activity  . Alcohol use: Yes    Alcohol/week: 18.0 standard drinks    Types: 18 Cans of beer per week    Comment: moderate  . Drug use: No  . Sexual activity: Yes  Lifestyle  . Physical activity:    Days per week: Not on file    Minutes per session: Not on file  . Stress: Not on file  Relationships  . Social connections:    Talks on phone: Not on file   Gets together: Not on file    Attends religious service: Not on file    Active member of club or organization: Not on file    Attends meetings of clubs or organizations: Not on file    Relationship status: Not on file  . Intimate partner violence:    Fear of current or ex partner: Not on file    Emotionally abused: Not on file    Physically abused: Not on file    Forced sexual activity: Not on file  Other Topics Concern  . Not on file  Social History Narrative  . Not on file     Constitutional: Denies fever, malaise, fatigue, headache or abrupt weight changes.  Respiratory: Denies difficulty breathing, shortness of breath, cough or sputum production.   Cardiovascular: Denies chest pain, chest tightness, palpitations or swelling in the hands or feet.  Neurological: Pt reports excessive daytime sleepiness, snoring. Denies dizziness, difficulty with memory, difficulty with speech or problems with balance and coordination.    No other specific complaints in a complete review of systems (except as listed in HPI  above).  Objective:   Physical Exam   BP 124/86   Pulse 78   Temp 98.4 F (36.9 C) (Oral)   Wt 243 lb (110.2 kg)   SpO2 98%   BMI 34.87 kg/m  Wt Readings from Last 3 Encounters:  04/27/18 243 lb (110.2 kg)  02/08/18 246 lb (111.6 kg)  05/25/17 243 lb (110.2 kg)    General: Appears her stated age, well developed, well nourished in NAD. Neck:  Neck supple, trachea midline. No masses, lumps or thyromegaly present. 16.5 inches Cardiovascular: Normal rate and rhythm. S1,S2 noted.  No murmur, rubs or gallops noted.  Pulmonary/Chest: Normal effort and positive vesicular breath sounds. No respiratory distress. No wheezes, rales or ronchi noted.  Neurological: Alert and oriented.    BMET    Component Value Date/Time   NA 141 02/08/2018 1538   K 4.3 02/08/2018 1538   CL 101 02/08/2018 1538   CO2 24 02/08/2018 1538   GLUCOSE 88 02/08/2018 1538   BUN 13 02/08/2018 1538    CREATININE 0.92 02/08/2018 1538   CALCIUM 9.8 02/08/2018 1538   GFRNONAA 97 02/08/2018 1538   GFRAA 112 02/08/2018 1538    Lipid Panel     Component Value Date/Time   CHOL 227 (H) 02/08/2018 1538   TRIG 166 (H) 02/08/2018 1538   HDL 53 02/08/2018 1538   CHOLHDL 4.3 02/08/2018 1538   LDLCALC 141 (H) 02/08/2018 1538    CBC    Component Value Date/Time   WBC 6.1 02/08/2018 1538   RBC 5.09 02/08/2018 1538   HGB 16.3 02/08/2018 1538   HCT 46.8 02/08/2018 1538   PLT 264 02/08/2018 1538   MCV 92 02/08/2018 1538   MCH 32.0 02/08/2018 1538   MCHC 34.8 02/08/2018 1538   RDW 12.9 02/08/2018 1538    Hgb A1C Lab Results  Component Value Date   HGBA1C 5.0 09/22/2016           Assessment & Plan:   Snoring, Excessive Daytime Sleepiness:  Epworth Sleepiness Scale: 16 Referral placed to pulmonology for sleep study Encouraged weight loss  Return precautions discussed Webb Silversmith, NP

## 2018-05-10 ENCOUNTER — Ambulatory Visit: Payer: Managed Care, Other (non HMO) | Admitting: Internal Medicine

## 2018-05-10 ENCOUNTER — Encounter: Payer: Self-pay | Admitting: Internal Medicine

## 2018-05-10 VITALS — BP 162/100 | HR 84 | Ht 70.0 in | Wt 242.4 lb

## 2018-05-10 DIAGNOSIS — G4719 Other hypersomnia: Secondary | ICD-10-CM

## 2018-05-10 NOTE — Patient Instructions (Signed)
Obtain Sleep Study

## 2018-05-10 NOTE — Progress Notes (Signed)
Name: Luis Perez MRN: [id number] DOB: 03-06-1966     CONSULTATION DATE: 12.5.19 REFERRING MD : bade  CHIEF COMPLAINT: excessive daytime sleepiness  STUDIES:  No CXR or CT scan of chest in chart  HISTORY OF PRESENT ILLNESS: 52 yo pleasant white male seen today for excessive daytime sleepiness   Patient  has been having sleep problems for many years Patient has been having excessive daytime sleepiness Patient has been having extreme fatigue and tiredness, lack of energy +  very Loud snoring every night + struggling breathe at night and gasps for air  Discussed sleep data and reviewed with patient.  Encouraged proper weight management.  Discussed driving precautions and its relationship with hypersomnolence.  Discussed operating dangerous equipment and its relationship with hypersomnolence.  Discussed sleep hygiene, and benefits of a fixed sleep waked time.  The importance of getting eight or more hours of sleep discussed with patient.  Discussed limiting the use of the computer and television before bedtime.  Decrease naps during the day, so night time sleep will become enhanced.  Limit caffeine, and sleep deprivation.  HTN, stroke, and heart failure are potential risk factors.    Patient does have elevated BP 162/100 at this time No signs of infection No signs of CHF   Non-Smoker works at [employer] :   has a past medical history of Chicken pox.  has a past surgical history that includes Tonsillectomy and adenoidectomy (1985). Prior to Admission medications   Medication Sig Start Date End Date Taking? Authorizing Provider  omeprazole (PRILOSEC) 10 MG capsule Take 10 mg by mouth daily.   Yes [provider]   Allergies  Allergen Reactions  . Allegra-D [Fexofenadine-Pseudoephed Er] Swelling    Swelling of the eyes    FAMILY HISTORY:  family history includes Arthritis in his maternal grandfather and  mother. SOCIAL HISTORY:  reports that he has never smoked. He has never used smokeless tobacco. He reports that he drinks about 18.0 standard drinks of alcohol per week. He reports that he does not use drugs.  REVIEW OF SYSTEMS:   Constitutional: Negative for fever, chills, weight loss, malaise/fatigue and diaphoresis.  HENT: Negative for hearing loss, ear pain, nosebleeds, congestion, sore throat, neck pain, tinnitus and ear discharge.   Eyes: Negative for blurred vision, double vision, photophobia, pain, discharge and redness.  Respiratory: Negative for cough, hemoptysis, sputum production, shortness of breath, wheezing and stridor.   Cardiovascular: Negative for chest pain, palpitations, orthopnea, claudication, leg swelling and PND.  Gastrointestinal: Negative for heartburn, nausea, vomiting, abdominal pain, diarrhea, constipation, blood in stool and melena.  Genitourinary: Negative for dysuria, urgency, frequency, hematuria and flank pain.  Musculoskeletal: Negative for myalgias, back pain, joint pain and falls.  Skin: Negative for itching and rash.  Neurological: Negative for dizziness, tingling, tremors, sensory change, speech change, focal weakness, seizures, loss of consciousness, weakness and headaches.  Endo/Heme/Allergies: Negative for environmental allergies and polydipsia. Does not bruise/bleed easily.  ALL OTHER ROS ARE NEGATIVE   BP (!) 162/100 (BP Location: Left Arm, Cuff Size: Normal)   Pulse 84   Ht 5\' 10"  (1.778 m)   Wt 242 lb 6.4 oz (110 kg)   SpO2 97%   BMI 34.78 kg/m    Physical Examination:   GENERAL:NAD, no fevers, chills, no weakness no fatigue HEAD: Normocephalic, atraumatic.  EYES: Pupils equal, round, reactive to light. Extraocular muscles intact. No scleral icterus.  MOUTH: Moist mucosal membrane.  EAR, NOSE, THROAT: Clear without exudates. No external lesions.  NECK: Supple. No thyromegaly. No nodules. No JVD.  PULMONARY:CTA B/L no wheezes, no  crackles, no rhonchi CARDIOVASCULAR: S1 and S2. Regular rate and rhythm. No murmurs, rubs, or gallops. No edema.  GASTROINTESTINAL: Soft, nontender, nondistended. No masses. Positive bowel sounds.  MUSCULOSKELETAL: No swelling, clubbing, or edema. Range of motion full in all extremities.  NEUROLOGIC: Cranial nerves II through XII are intact. No gross focal neurological deficits.  SKIN: No ulceration, lesions, rashes, or cyanosis. Skin warm and dry. Turgor intact.  PSYCHIATRIC: Mood, affect within normal limits. The patient is awake, alert and oriented x 3. Insight, judgment intact.      ASSESSMENT / PLAN:  52 yo pleasant white male with signs and symptoms of excessive daytiem sleepiness with snoring and gasping for air highly suggestive of underlying obstructive sleep apnea  Patient needs Sleep Study ASAP  Obesity -recommend significant weight loss -recommend changing diet  Deconditioned state -Recommend increased daily activity and exercise    Patien satisfied with Plan of action and management. All questions answered Follow up after test completed   Corrin Parker, M.D.  [facility]  Medical Director [place] Director [facility] Cardio-Pulmonary Department

## 2018-05-12 LAB — LIPID PANEL
CHOLESTEROL TOTAL: 225 mg/dL — AB (ref 100–199)
Chol/HDL Ratio: 3.9 ratio (ref 0.0–5.0)
HDL: 57 mg/dL (ref >39)
LDL Calculated: 134 mg/dL — ABNORMAL HIGH (ref 0–99)
TRIGLYCERIDES: 170 mg/dL — AB (ref 0–149)
VLDL CHOLESTEROL CAL: 34 mg/dL (ref 5–40)

## 2018-05-29 DIAGNOSIS — G4733 Obstructive sleep apnea (adult) (pediatric): Secondary | ICD-10-CM

## 2018-06-13 DIAGNOSIS — G4733 Obstructive sleep apnea (adult) (pediatric): Secondary | ICD-10-CM | POA: Diagnosis not present

## 2018-06-14 ENCOUNTER — Telehealth: Payer: Self-pay

## 2018-06-14 DIAGNOSIS — G4719 Other hypersomnia: Secondary | ICD-10-CM

## 2018-06-14 DIAGNOSIS — G4733 Obstructive sleep apnea (adult) (pediatric): Secondary | ICD-10-CM

## 2018-06-14 NOTE — Telephone Encounter (Signed)
LM on VM for patient to call for sleep study results.   Severe OSA seen in all positions with AHI of 73. Severe OSA seen in the prone position with AHI of 43.  Recommend auto-CPAP with pressure range of 5-20 cm H2O.

## 2018-06-15 NOTE — Addendum Note (Signed)
Addended by: Darreld Mclean on: 06/15/2018 02:57 PM   Modules accepted: Orders

## 2018-06-15 NOTE — Telephone Encounter (Signed)
lmtcb for pt.  

## 2018-06-15 NOTE — Telephone Encounter (Signed)
lmtcb x2 for pt. 

## 2018-06-15 NOTE — Telephone Encounter (Signed)
Pt called back- [phone]

## 2018-06-18 ENCOUNTER — Telehealth: Payer: Self-pay | Admitting: Internal Medicine

## 2018-06-18 NOTE — Addendum Note (Signed)
Addended by: Darreld Mclean on: 06/18/2018 12:09 PM   Modules accepted: Orders

## 2018-06-18 NOTE — Telephone Encounter (Signed)
Called patient

## 2018-10-05 ENCOUNTER — Ambulatory Visit (INDEPENDENT_AMBULATORY_CARE_PROVIDER_SITE_OTHER): Payer: Managed Care, Other (non HMO) | Admitting: Internal Medicine

## 2018-10-05 ENCOUNTER — Encounter: Payer: Self-pay | Admitting: Internal Medicine

## 2018-10-05 ENCOUNTER — Telehealth: Payer: Self-pay | Admitting: Internal Medicine

## 2018-10-05 DIAGNOSIS — J301 Allergic rhinitis due to pollen: Secondary | ICD-10-CM | POA: Diagnosis not present

## 2018-10-05 NOTE — Telephone Encounter (Signed)
Patient returned Melanie's call. °

## 2018-10-06 ENCOUNTER — Encounter: Payer: Self-pay | Admitting: Internal Medicine

## 2018-10-08 ENCOUNTER — Encounter: Payer: Self-pay | Admitting: Internal Medicine

## 2018-10-08 NOTE — Progress Notes (Signed)
Virtual Visit via Video Note  I connected with Luis Perez on 10/08/18 at  3:45 PM EDT by a video enabled telemedicine application and verified that I am speaking with the correct person using two identifiers.  Location: Patient: In his Personal Vehicle. Provider: Home   I discussed the limitations of evaluation and management by telemedicine and the availability of in person appointments. The patient expressed understanding and agreed to proceed.  History of Present Illness:  Pt reports nasal congestion, scratchy throat, cough and chest congestion. This started 2 days ago. He is not blowing anything out of his nose. He denies difficulty swallowing. The cough is productive of clear mucous at times. He denies facial pain and pressure, runny nose, ear pain or shortness of breath. He denies fever, chills or body aches. He has tried Mucinex with minimal relief. He has not had sick contacts that he is aware of.     Past Medical History:  Diagnosis Date  . Chicken pox     Current Outpatient Medications  Medication Sig Dispense Refill  . omeprazole (PRILOSEC) 10 MG capsule Take 10 mg by mouth daily.     No current facility-administered medications for this visit.     Allergies  Allergen Reactions  . Allegra-D [Fexofenadine-Pseudoephed Er] Swelling    Swelling of the eyes    Family History  Problem Relation Age of Onset  . Arthritis Mother   . Arthritis Maternal Grandfather   . Cancer Neg Hx   . Diabetes Neg Hx   . Heart disease Neg Hx   . Stroke Neg Hx     Social History   Socioeconomic History  . Marital status: Married    Spouse name: Not on file  . Number of children: Not on file  . Years of education: Not on file  . Highest education level: Not on file  Occupational History  . Not on file  Social Needs  . Financial resource strain: Not on file  . Food insecurity:    Worry: Not on file    Inability: Not on file  . Transportation needs:    Medical: Not on  file    Non-medical: Not on file  Tobacco Use  . Smoking status: Never Smoker  . Smokeless tobacco: Never Used  Substance and Sexual Activity  . Alcohol use: Yes    Alcohol/week: 18.0 standard drinks    Types: 18 Cans of beer per week    Comment: moderate  . Drug use: No  . Sexual activity: Yes  Lifestyle  . Physical activity:    Days per week: Not on file    Minutes per session: Not on file  . Stress: Not on file  Relationships  . Social connections:    Talks on phone: Not on file    Gets together: Not on file    Attends religious service: Not on file    Active member of club or organization: Not on file    Attends meetings of clubs or organizations: Not on file    Relationship status: Not on file  . Intimate partner violence:    Fear of current or ex partner: Not on file    Emotionally abused: Not on file    Physically abused: Not on file    Forced sexual activity: Not on file  Other Topics Concern  . Not on file  Social History Narrative  . Not on file     Constitutional: Denies fever, malaise, fatigue, headache or abrupt weight  changes.  HEENT: Pt reports nasal congestion. Denies eye pain, eye redness, ear pain, ringing in the ears, wax buildup, runny nose,  bloody nose, or sore throat. Respiratory: Pt reports cough. Denies difficulty breathing, shortness of breath.   Cardiovascular: Denies chest pain, chest tightness, palpitations or swelling in the hands or feet.  Gastrointestinal: Denies abdominal pain, bloating, constipation, diarrhea or blood in the stool.  Skin: Denies redness, rashes, lesions or ulcercations.   No other specific complaints in a complete review of systems (except as listed in HPI above).   Wt Readings from Last 3 Encounters:  05/10/18 242 lb 6.4 oz (110 kg)  04/27/18 243 lb (110.2 kg)  02/08/18 246 lb (111.6 kg)    General: Appears his stated age, obese in NAD. Skin: Warm, dry and intact.  HEENT: Head: normal shape and size; Eyes:  sclera white, EOMs intact;  Pulmonary/Chest: Normal effort. No respiratory distress. Neurological: Alert and oriented.    BMET    Component Value Date/Time   NA 141 02/08/2018 1538   K 4.3 02/08/2018 1538   CL 101 02/08/2018 1538   CO2 24 02/08/2018 1538   GLUCOSE 88 02/08/2018 1538   BUN 13 02/08/2018 1538   CREATININE 0.92 02/08/2018 1538   CALCIUM 9.8 02/08/2018 1538   GFRNONAA 97 02/08/2018 1538   GFRAA 112 02/08/2018 1538    Lipid Panel     Component Value Date/Time   CHOL 225 (H) 05/11/2018 1431   TRIG 170 (H) 05/11/2018 1431   HDL 57 05/11/2018 1431   CHOLHDL 3.9 05/11/2018 1431   LDLCALC 134 (H) 05/11/2018 1431    CBC    Component Value Date/Time   WBC 6.1 02/08/2018 1538   RBC 5.09 02/08/2018 1538   HGB 16.3 02/08/2018 1538   HCT 46.8 02/08/2018 1538   PLT 264 02/08/2018 1538   MCV 92 02/08/2018 1538   MCH 32.0 02/08/2018 1538   MCHC 34.8 02/08/2018 1538   RDW 12.9 02/08/2018 1538    Hgb A1C Lab Results  Component Value Date   HGBA1C 5.0 09/22/2016        Assessment and Plan:  Allergic Rhinitis:  Start Zyrtec and Flonase OTC Stop Mucinex If no improvement in 1 week, let me know.  Follow Up Instructions:    I discussed the assessment and treatment plan with the patient. The patient was provided an opportunity to ask questions and all were answered. The patient agreed with the plan and demonstrated an understanding of the instructions.   The patient was advised to call back or seek an in-person evaluation if the symptoms worsen or if the condition fails to improve as anticipated.    Webb Silversmith, NP

## 2018-10-08 NOTE — Patient Instructions (Signed)

## 2019-05-01 ENCOUNTER — Other Ambulatory Visit: Payer: Self-pay

## 2019-05-01 ENCOUNTER — Telehealth: Payer: Managed Care, Other (non HMO) | Admitting: Family

## 2019-05-01 DIAGNOSIS — Z20828 Contact with and (suspected) exposure to other viral communicable diseases: Secondary | ICD-10-CM

## 2019-05-01 DIAGNOSIS — Z20822 Contact with and (suspected) exposure to covid-19: Secondary | ICD-10-CM

## 2019-05-01 MED ORDER — BENZONATATE 100 MG PO CAPS: 100.0000 mg | ORAL_CAPSULE | Freq: Three times a day (TID) | ORAL | 0 refills | Status: DC | PRN

## 2019-05-01 NOTE — Progress Notes (Signed)
E-Visit for Corona Virus Screening   Your current symptoms could be consistent with the coronavirus.  Many health care providers can now test patients at their office but not all are.  [place] has multiple testing sites. For information on our COVID testing locations and hours go to [email or web address]  Please quarantine yourself while awaiting your test results.  We are enrolling you in our [place] for [place] . Daily you will receive a questionnaire within the [facility] website. Our COVID 19 response team willl be monitoriing your responses daily. Please continue good preventive care measures, including:  frequent hand-washing, avoid touching your face, cover coughs/sneezes, stay out of crowds and keep a 6 foot distance from others.   You can go to one of the testing sites listed below, while they are opened (see hours). You do not need an order and will stay in your car during the test. You do need to self isolate until your results return and if positive 14 days from when your symptoms started and until you are 3 days fever free.   Testing Locations (Monday - Friday, 10 a.m. - 3 p.m.) . [place]: [facility] at [facility], [address] [place], [place]: [place], [place], [place], [place] (entrance off [address])  . [facility]: (Closed each Monday): Testing site relocated to the short stay covered drive at [facility]. (Use the [facility] entrance to [facility] next to [place] is a respiratory illness with symptoms that are similar to the flu. Symptoms are typically mild to moderate, but there have been cases of severe illness and death due to the virus. The following symptoms may appear 2-14 days after exposure: . Fever . Cough . Shortness of breath or difficulty breathing . Chills . Repeated shaking with chills . Muscle  pain . Headache . Sore throat . New loss of taste or smell . Fatigue . Congestion or runny nose . Nausea or vomiting . Diarrhea  If you develop fever/cough/breathlessness, please stay home for 10 days with improving symptoms and until you have had 24 hours of no fever (without taking a fever reducer).  Go to the nearest hospital ED for assessment if fever/cough/breathlessness are severe or illness seems like a threat to life.  It is vitally important that if you feel that you have an infection such as this virus or any other virus that you stay home and away from places where you may spread it to others.  You should avoid contact with people age 51 and older.   You should wear a mask or cloth face covering over your nose and mouth if you must be around other people or animals, including pets (even at home). Try to stay at least 6 feet away from other people. This will protect the people around you.  You can use medication such as A prescription cough medication called Tessalon Perles 100 mg. You may take 1-2 capsules every 8 hours as needed for cough.  You may also take acetaminophen (Tylenol) as needed for fever.   Reduce your risk of any infection by using the same precautions used for avoiding the common cold or flu:  Marland Kitchen Wash your hands often with soap and warm water for at least 20 seconds.  If soap and water are not readily available, use an alcohol-based hand sanitizer with at least 60% alcohol.  . If coughing or  sneezing, cover your mouth and nose by coughing or sneezing into the elbow areas of your shirt or coat, into a tissue or into your sleeve (not your hands). . Avoid shaking hands with others and consider head nods or verbal greetings only. . Avoid touching your eyes, nose, or mouth with unwashed hands.  . Avoid close contact with people who are sick. . Avoid places or events with large numbers of people in one location, like concerts or sporting events. . Carefully consider  travel plans you have or are making. . If you are planning any travel outside or inside the [place], visit the CDC's Travelers' Health webpage for the latest health notices. . If you have some symptoms but not all symptoms, continue to monitor at home and seek medical attention if your symptoms worsen. . If you are having a medical emergency, call 911.  HOME CARE . Only take medications as instructed by your medical team. . Drink plenty of fluids and get plenty of rest. . A steam or ultrasonic humidifier can help if you have congestion.   GET HELP RIGHT AWAY IF YOU HAVE EMERGENCY WARNING SIGNS** FOR COVID-19. If you or someone is showing any of these signs seek emergency medical care immediately. Call 911 or proceed to your closest emergency facility if: . You develop worsening high fever. . Trouble breathing . Bluish lips or face . Persistent pain or pressure in the chest . New confusion . Inability to wake or stay awake . You cough up blood. . Your symptoms become more severe  **This list is not all possible symptoms. Contact your medical provider for any symptoms that are sever or concerning to you.   MAKE SURE YOU   Understand these instructions.  Will watch your condition.  Will get help right away if you are not doing well or get worse.  Your e-visit answers were reviewed by a board certified advanced clinical practitioner to complete your personal care plan.  Depending on the condition, your plan could have included both over the counter or prescription medications.  If there is a problem please reply once you have received a response from your provider.  Your safety is important to [place].  If you have drug allergies check your prescription carefully.    You can use MyChart to ask questions about today's visit, request a non-urgent call back, or ask for a work or school excuse for 24 hours related to this e-Visit. If it has been greater than 24 hours you will need to follow up with  your provider, or enter a new e-Visit to address those concerns. You will get an e-mail in the next two days asking about your experience.  I hope that your e-visit has been valuable and will speed your recovery. Thank you for using e-visits.  Approximately 5 minutes was spent documenting and reviewing patient's chart.

## 2019-05-02 ENCOUNTER — Encounter (INDEPENDENT_AMBULATORY_CARE_PROVIDER_SITE_OTHER): Payer: Self-pay

## 2019-05-02 LAB — NOVEL CORONAVIRUS, NAA: SARS-CoV-2, NAA: NOT DETECTED

## 2019-05-03 ENCOUNTER — Encounter (INDEPENDENT_AMBULATORY_CARE_PROVIDER_SITE_OTHER): Payer: Self-pay

## 2019-05-05 ENCOUNTER — Encounter (INDEPENDENT_AMBULATORY_CARE_PROVIDER_SITE_OTHER): Payer: Self-pay

## 2019-05-06 ENCOUNTER — Encounter (INDEPENDENT_AMBULATORY_CARE_PROVIDER_SITE_OTHER): Payer: Self-pay

## 2019-05-08 ENCOUNTER — Encounter (INDEPENDENT_AMBULATORY_CARE_PROVIDER_SITE_OTHER): Payer: Self-pay

## 2019-05-16 ENCOUNTER — Other Ambulatory Visit: Payer: Self-pay

## 2019-05-16 DIAGNOSIS — Z20822 Contact with and (suspected) exposure to covid-19: Secondary | ICD-10-CM

## 2019-05-18 LAB — NOVEL CORONAVIRUS, NAA: SARS-CoV-2, NAA: NOT DETECTED

## 2019-10-15 ENCOUNTER — Ambulatory Visit: Payer: Managed Care, Other (non HMO) | Admitting: Internal Medicine

## 2019-11-05 ENCOUNTER — Ambulatory Visit: Payer: Managed Care, Other (non HMO) | Admitting: Internal Medicine

## 2019-11-13 ENCOUNTER — Encounter: Payer: Self-pay | Admitting: Internal Medicine

## 2019-11-13 ENCOUNTER — Other Ambulatory Visit: Payer: Self-pay

## 2019-11-13 ENCOUNTER — Ambulatory Visit (INDEPENDENT_AMBULATORY_CARE_PROVIDER_SITE_OTHER): Payer: Managed Care, Other (non HMO) | Admitting: Internal Medicine

## 2019-11-13 VITALS — BP 126/84 | HR 65 | Temp 97.9°F | Ht 69.75 in | Wt 255.0 lb

## 2019-11-13 DIAGNOSIS — R42 Dizziness and giddiness: Secondary | ICD-10-CM | POA: Diagnosis not present

## 2019-11-13 DIAGNOSIS — Z1152 Encounter for screening for COVID-19: Secondary | ICD-10-CM | POA: Diagnosis not present

## 2019-11-13 DIAGNOSIS — K219 Gastro-esophageal reflux disease without esophagitis: Secondary | ICD-10-CM

## 2019-11-13 DIAGNOSIS — Z125 Encounter for screening for malignant neoplasm of prostate: Secondary | ICD-10-CM | POA: Diagnosis not present

## 2019-11-13 DIAGNOSIS — Z0000 Encounter for general adult medical examination without abnormal findings: Secondary | ICD-10-CM | POA: Diagnosis not present

## 2019-11-13 MED ORDER — MECLIZINE HCL 25 MG PO TABS: 25.0000 mg | ORAL_TABLET | Freq: Three times a day (TID) | ORAL | 0 refills | Status: DC | PRN

## 2019-11-13 MED ORDER — OMEPRAZOLE 40 MG PO CPDR
40.0000 mg | DELAYED_RELEASE_CAPSULE | Freq: Every day | ORAL | 3 refills | Status: DC
Start: 2019-11-13 — End: 2019-11-14

## 2019-11-13 NOTE — Progress Notes (Signed)
Subjective:    Patient ID: Luis Perez, male    DOB: May 17, 1966, 54 y.o.   MRN: [id number]  HPI  Pt presents to the clinic today for his annual exam.  GERD: Triggered by spicy foods. He reports breakthrough on Omeprazole OTC. There is no upper GI on file.  He also c/o intermittent dizziness. This started 4 days ago. It has been consistent. It is not worse with position changes or head movements. He denies visual changes. He denies runny nose, nasal congestion, ear pain or sore throat. He intermittently has some ringing in his ears. He denies neck pain. He has not taken anything OTC for her symptoms.   Flu: 03/2019 Tetanus: > 10 years ago Covid: Pfizer PSA Screening: 02/2018 Colon Screening: never Vision Screening: annually Dentist: biannually  Diet: He does eat meat. He consumes fruits and veggies. He does eat some fried foods. He drinks mostly water. Exercise: None  Review of Systems      Past Medical History:  Diagnosis Date   Chicken pox     Current Outpatient Medications  Medication Sig Dispense Refill   benzonatate (TESSALON PERLES) 100 MG capsule Take 1 capsule (100 mg total) by mouth 3 (three) times daily as needed. 20 capsule 0   omeprazole (PRILOSEC) 10 MG capsule Take 10 mg by mouth daily.     No current facility-administered medications for this visit.    Allergies  Allergen Reactions   Allegra-D [Fexofenadine-Pseudoephed Er] Swelling    Swelling of the eyes    Family History  Problem Relation Age of Onset   Arthritis Mother    Arthritis Maternal Grandfather    Cancer Neg Hx    Diabetes Neg Hx    Heart disease Neg Hx    Stroke Neg Hx     Social History   Socioeconomic History   Marital status: Married    Spouse name: Not on file   Number of children: Not on file   Years of education: Not on file   Highest education level: Not on file  Occupational History   Not on file  Tobacco Use   Smoking status: Never Smoker    Smokeless tobacco: Never Used  Substance and Sexual Activity   Alcohol use: Yes    Alcohol/week: 18.0 standard drinks    Types: 18 Cans of beer per week    Comment: moderate   Drug use: No   Sexual activity: Yes  Other Topics Concern   Not on file  Social History Narrative   Not on file   Social Determinants of Health   Financial Resource Strain:    Difficulty of Paying Living Expenses:   Food Insecurity:    Worried About Charity fundraiser in the Last Year:    Arboriculturist in the Last Year:   Transportation Needs:    Film/video editor (Medical):    Lack of Transportation (Non-Medical):   Physical Activity:    Days of Exercise per Week:    Minutes of Exercise per Session:   Stress:    Feeling of Stress :   Social Connections:    Frequency of Communication with Friends and Family:    Frequency of Social Gatherings with Friends and Family:    Attends Religious Services:    Active Member of Clubs or Organizations:    Attends Archivist Meetings:    Marital Status:   Intimate Partner Violence:    Fear of Current or Ex-Partner:  Emotionally Abused:    Physically Abused:    Sexually Abused:      Constitutional: Denies fever, malaise, fatigue, headache or abrupt weight changes.  HEENT: Denies eye pain, eye redness, ear pain, ringing in the ears, wax buildup, runny nose, nasal congestion, bloody nose, or sore throat. Respiratory: Denies difficulty breathing, shortness of breath, cough or sputum production.   Cardiovascular: Denies chest pain, chest tightness, palpitations or swelling in the hands or feet.  Gastrointestinal: Pt reports reflux. Denies abdominal pain, bloating, constipation, diarrhea or blood in the stool.  GU: Denies urgency, frequency, pain with urination, burning sensation, blood in urine, odor or discharge. Musculoskeletal: Denies decrease in range of motion, difficulty with gait, muscle pain or joint pain and  swelling.  Skin: Denies redness, rashes, lesions or ulcercations.  Neurological: Pt reports intermittent lightheadedness. Denies difficulty with memory, difficulty with speech or problems with balance and coordination.  Psych: Denies anxiety, depression, SI/HI.  No other specific complaints in a complete review of systems (except as listed in HPI above).  Objective:   Physical Exam   BP 126/84    Pulse 65    Temp 97.9 F (36.6 C) (Temporal)    Ht 5' 9.75" (1.772 m)    Wt 255 lb (115.7 kg)    SpO2 98%    BMI 36.85 kg/m   Wt Readings from Last 3 Encounters:  05/10/18 242 lb 6.4 oz (110 kg)  04/27/18 243 lb (110.2 kg)  02/08/18 246 lb (111.6 kg)    General: Appears his tated age, obese, in NAD. Skin: Warm, dry and intact. No rashes noted. HEENT: Head: normal shape and size; Eyes: sclera white, no icterus, conjunctiva pink, PERRLA and EOMs intact; Ears: Tm's gray and intact, normal light reflex;  Neck:  Neck supple, trachea midline. No masses, lumps or thyromegaly present.  Cardiovascular: Normal rate and rhythm. S1,S2 noted.  No murmur, rubs or gallops noted. No JVD or BLE edema. No carotid bruits noted. Pulmonary/Chest: Normal effort and positive vesicular breath sounds. No respiratory distress. No wheezes, rales or ronchi noted.  Abdomen: Soft and nontender. Normal bowel sounds. No distention or masses noted. Liver, spleen and kidneys non palpable. Musculoskeletal: Strength 5/5 BUE/BLE. No difficulty with gait.  Neurological: Alert and oriented. Cranial nerves II-XII grossly intact. Coordination normal.  Psychiatric: Mood and affect normal. Behavior is normal. Judgment and thought content normal.     BMET    Component Value Date/Time   NA 141 02/08/2018 1538   K 4.3 02/08/2018 1538   CL 101 02/08/2018 1538   CO2 24 02/08/2018 1538   GLUCOSE 88 02/08/2018 1538   BUN 13 02/08/2018 1538   CREATININE 0.92 02/08/2018 1538   CALCIUM 9.8 02/08/2018 1538   GFRNONAA 97 02/08/2018  1538   GFRAA 112 02/08/2018 1538    Lipid Panel     Component Value Date/Time   CHOL 225 (H) 05/11/2018 1431   TRIG 170 (H) 05/11/2018 1431   HDL 57 05/11/2018 1431   CHOLHDL 3.9 05/11/2018 1431   LDLCALC 134 (H) 05/11/2018 1431    CBC    Component Value Date/Time   WBC 6.1 02/08/2018 1538   RBC 5.09 02/08/2018 1538   HGB 16.3 02/08/2018 1538   HCT 46.8 02/08/2018 1538   PLT 264 02/08/2018 1538   MCV 92 02/08/2018 1538   MCH 32.0 02/08/2018 1538   MCHC 34.8 02/08/2018 1538   RDW 12.9 02/08/2018 1538    Hgb A1C Lab Results  Component Value Date  HGBA1C 5.0 09/22/2016           Assessment & Plan:   Preventative Health Maintenance:  Encouraged him to get a flu shot in the fall Tetanus due- he declines today Covid UTD Referral to GI for colon screening Encouraged him to consume a balanced diet and exercise regimen Advised him to see an eye doctor and dentist annually Will check CBC, CMET, Lipid, PSA and A1C today  Lightheadedness:  Exam benign Will check CBC, CMET and A1C  RTC in 1 year, sooner if needed Webb Silversmith, NP This visit occurred during the SARS-CoV-2 public health emergency.  Safety protocols were in place, including screening questions prior to the visit, additional usage of staff PPE, and extensive cleaning of exam room while observing appropriate contact time as indicated for disinfecting solutions.

## 2019-11-13 NOTE — Patient Instructions (Signed)

## 2019-11-13 NOTE — Assessment & Plan Note (Signed)
Will increase Omeprazole to 40 mg PO daily, RX sent to pharmacy CBC and CMET today

## 2019-11-14 LAB — COMPREHENSIVE METABOLIC PANEL
ALT: 55 IU/L — ABNORMAL HIGH (ref 0–44)
AST: 25 IU/L (ref 0–40)
Albumin/Globulin Ratio: 1.8 (ref 1.2–2.2)
Albumin: 4.5 g/dL (ref 3.8–4.9)
Alkaline Phosphatase: 85 IU/L (ref 48–121)
BUN/Creatinine Ratio: 18 (ref 9–20)
BUN: 15 mg/dL (ref 6–24)
Bilirubin Total: 0.3 mg/dL (ref 0.0–1.2)
CO2: 24 mmol/L (ref 20–29)
Calcium: 9.8 mg/dL (ref 8.7–10.2)
Chloride: 104 mmol/L (ref 96–106)
Creatinine, Ser: 0.82 mg/dL (ref 0.76–1.27)
GFR calc Af Amer: 118 mL/min/{1.73_m2} (ref >59)
GFR calc non Af Amer: 102 mL/min/{1.73_m2} (ref >59)
Globulin, Total: 2.5 g/dL (ref 1.5–4.5)
Glucose: 83 mg/dL (ref 65–99)
Potassium: 4.4 mmol/L (ref 3.5–5.2)
Sodium: 140 mmol/L (ref 134–144)
Total Protein: 7 g/dL (ref 6.0–8.5)

## 2019-11-14 LAB — CBC
Hematocrit: 45.1 % (ref 37.5–51.0)
Hemoglobin: 15.7 g/dL (ref 13.0–17.7)
MCH: 31.9 pg (ref 26.6–33.0)
MCHC: 34.8 g/dL (ref 31.5–35.7)
MCV: 92 fL (ref 79–97)
Platelets: 236 10*3/uL (ref 150–450)
RBC: 4.92 x10E6/uL (ref 4.14–5.80)
RDW: 13.1 % (ref 11.6–15.4)
WBC: 5.4 10*3/uL (ref 3.4–10.8)

## 2019-11-14 LAB — SARS-COV-2 SEMI-QUANTITATIVE TOTAL ANTIBODY, SPIKE: SARS-CoV-2 Semi-Quant Total Ab: >2500 U/mL (ref <0.8)

## 2019-11-14 LAB — LIPID PANEL
Chol/HDL Ratio: 4.5 ratio (ref 0.0–5.0)
Cholesterol, Total: 215 mg/dL — ABNORMAL HIGH (ref 100–199)
HDL: 48 mg/dL (ref >39)
LDL Chol Calc (NIH): 134 mg/dL — ABNORMAL HIGH (ref 0–99)
Triglycerides: 186 mg/dL — ABNORMAL HIGH (ref 0–149)
VLDL Cholesterol Cal: 33 mg/dL (ref 5–40)

## 2019-11-14 LAB — HEMOGLOBIN A1C
Est. average glucose Bld gHb Est-mCnc: 103 mg/dL
Hgb A1c MFr Bld: 5.2 % (ref 4.8–5.6)

## 2019-11-14 LAB — PSA: Prostate Specific Ag, Serum: 0.8 ng/mL (ref 0.0–4.0)

## 2019-11-14 MED ORDER — MECLIZINE HCL 25 MG PO TABS: 25.0000 mg | ORAL_TABLET | Freq: Three times a day (TID) | ORAL | 0 refills | Status: DC | PRN

## 2019-11-14 MED ORDER — OMEPRAZOLE 40 MG PO CPDR: 40.0000 mg | DELAYED_RELEASE_CAPSULE | Freq: Every day | ORAL | 2 refills | Status: DC

## 2019-11-22 ENCOUNTER — Telehealth: Payer: Self-pay

## 2019-11-22 NOTE — Telephone Encounter (Signed)
Health screening form has been faxed, copy sent to scan and original mailed to pt

## 2019-12-02 ENCOUNTER — Encounter: Payer: Self-pay | Admitting: Internal Medicine

## 2019-12-02 ENCOUNTER — Other Ambulatory Visit: Payer: Self-pay

## 2019-12-02 DIAGNOSIS — Z1211 Encounter for screening for malignant neoplasm of colon: Secondary | ICD-10-CM

## 2020-01-07 ENCOUNTER — Encounter: Payer: Self-pay | Admitting: Gastroenterology

## 2020-01-24 ENCOUNTER — Encounter: Payer: Self-pay | Admitting: Internal Medicine

## 2020-02-20 ENCOUNTER — Encounter: Payer: Managed Care, Other (non HMO) | Admitting: Gastroenterology

## 2020-02-24 ENCOUNTER — Other Ambulatory Visit: Payer: Self-pay

## 2020-02-24 ENCOUNTER — Ambulatory Visit (AMBULATORY_SURGERY_CENTER): Payer: Self-pay | Admitting: *Deleted

## 2020-02-24 ENCOUNTER — Encounter: Payer: Self-pay | Admitting: Gastroenterology

## 2020-02-24 VITALS — Ht 69.75 in | Wt 251.0 lb

## 2020-02-24 DIAGNOSIS — Z1211 Encounter for screening for malignant neoplasm of colon: Secondary | ICD-10-CM

## 2020-02-24 MED ORDER — SUTAB 1479-225-188 MG PO TABS: 1.0000 | ORAL_TABLET | ORAL | 0 refills | Status: DC

## 2020-02-24 NOTE — Progress Notes (Signed)
Patient is here in-person for PV. Patient denies any allergies to eggs or soy. Patient denies any problems with anesthesia/sedation. Patient denies any oxygen use at home. Patient denies taking any diet/weight loss medications or blood thinners. Patient is not being treated for MRSA or C-diff. Patient is aware of our care-partner policy and RALCM-62 safety protocol. EMMI education assisgned to the patient for the procedure, sent to MyChart.   COVID-19 vaccines completed on 09/2019, per patient.   Prep Prescription coupon given to the patient.

## 2020-03-05 ENCOUNTER — Other Ambulatory Visit: Payer: Self-pay

## 2020-03-05 ENCOUNTER — Encounter: Payer: Self-pay | Admitting: Gastroenterology

## 2020-03-05 ENCOUNTER — Ambulatory Visit (AMBULATORY_SURGERY_CENTER): Payer: Managed Care, Other (non HMO) | Admitting: Gastroenterology

## 2020-03-05 VITALS — BP 130/78 | HR 69 | Temp 97.8°F | Resp 13 | Ht 69.0 in | Wt 251.0 lb

## 2020-03-05 DIAGNOSIS — D128 Benign neoplasm of rectum: Secondary | ICD-10-CM

## 2020-03-05 DIAGNOSIS — D124 Benign neoplasm of descending colon: Secondary | ICD-10-CM | POA: Diagnosis not present

## 2020-03-05 DIAGNOSIS — Z1211 Encounter for screening for malignant neoplasm of colon: Secondary | ICD-10-CM | POA: Diagnosis present

## 2020-03-05 MED ORDER — SODIUM CHLORIDE 0.9 % IV SOLN: 500.0000 mL | Freq: Once | INTRAVENOUS | Status: DC

## 2020-03-05 NOTE — Op Note (Signed)
Luis Perez Patient Name: Luis Perez Procedure Date: 03/05/2020 9:32 AM MRN: [id number] Endoscopist: Justice Britain , MD Age: 54 Referring MD:  Date of Birth: [birth year] Gender: Male Account #: [id number] Procedure:                Colonoscopy Indications:              Screening for colorectal malignant neoplasm, This                            is the patient's first colonoscopy Medicines:                Monitored Anesthesia Care Procedure:                Pre-Anesthesia Assessment:                           - Prior to the procedure, a History and Physical                            was performed, and patient medications and                            allergies were reviewed. The patient's tolerance of                            previous anesthesia was also reviewed. The risks                            and benefits of the procedure and the sedation                            options and risks were discussed with the patient.                            All questions were answered, and informed consent                            was obtained. Prior Anticoagulants: The patient has                            taken no previous anticoagulant or antiplatelet                            agents. ASA Grade Assessment: II - A patient with                            mild systemic disease. After reviewing the risks                            and benefits, the patient was deemed in                            satisfactory condition to undergo the procedure.  After obtaining informed consent, the colonoscope                            was passed under direct vision. Throughout the                            procedure, the patient's blood pressure, pulse, and                            oxygen saturations were monitored continuously. The                            Colonoscope was introduced through the anus and                            advanced to the 3 cm into  the ileum. The                            colonoscopy was performed without difficulty. The                            patient tolerated the procedure. The quality of the                            bowel preparation was adequate. The terminal ileum,                            ileocecal valve, appendiceal orifice, and rectum                            were photographed. Scope In: 9:43:58 AM Scope Out: 10:04:24 AM Scope Withdrawal Time: 0 hours 16 minutes 43 seconds  Total Procedure Duration: 0 hours 20 minutes 26 seconds  Findings:                 The digital rectal exam findings include                            hemorrhoids. Pertinent negatives include no                            palpable rectal lesions.                           The terminal ileum and ileocecal valve appeared                            normal.                           Two sessile polyps were found in the rectum and                            descending colon. The polyps were 4 to 12 mm in  size. These polyps were removed with a cold snare.                            Resection and retrieval were complete.                           Multiple small-mouthed diverticula were found in                            the recto-sigmoid colon, sigmoid colon, descending                            colon and distal transverse colon.                           Segmental areas of mildly erythematous mucosa were                            found in the recto-sigmoid colon and in the sigmoid                            colon - this is areas between diverticulosis. This                            was biopsied with a cold forceps for histology to                            rule out chronic colitis/SCAD.                           Normal mucosa was found in the entire colon                            otherwise.                           Non-bleeding non-thrombosed external and internal                             hemorrhoids were found during retroflexion, during                            perianal exam and during digital exam. The                            hemorrhoids were Grade II (internal hemorrhoids                            that prolapse but reduce spontaneously). Complications:            No immediate complications. Estimated Blood Loss:     Estimated blood loss was minimal. Impression:               - Hemorrhoids found on digital rectal exam.                           -  The examined portion of the ileum was normal.                           - Two 4 to 12 mm polyps in the rectum and in the                            descending colon, removed with a cold snare.                            Resected and retrieved.                           - Diverticulosis in the recto-sigmoid colon, in the                            sigmoid colon, in the descending colon and in the                            distal transverse colon.                           - Erythematous mucosa in the recto-sigmoid colon                            and in the sigmoid colon. Biopsied.                           - Normal mucosa in the entire examined colon                            otehrwise.                           - Non-bleeding non-thrombosed external and internal                            hemorrhoids. Recommendation:           - The patient will be observed post-procedure,                            until all discharge criteria are met.                           - Discharge patient to home.                           - Patient has a contact number available for                            emergencies. The signs and symptoms of potential                            delayed complications were discussed with the  patient. Return to normal activities tomorrow.                            Written discharge instructions were provided to the                            patient.                            - High fiber diet.                           - Use FiberCon 1-2 tablets PO daily.                           - Continue present medications.                           - Await pathology results.                           - Repeat colonoscopy in 3 years for surveillance                            based on pathology results.                           - The findings and recommendations were discussed                            with the patient. Justice Britain, MD 03/05/2020 10:11:43 AM

## 2020-03-05 NOTE — Progress Notes (Signed)
Called to room to assist during endoscopic procedure.  Patient ID and intended procedure confirmed with present staff. Received instructions for my participation in the procedure from the performing physician.  

## 2020-03-05 NOTE — Progress Notes (Signed)
Report given to PACU, vss 

## 2020-03-05 NOTE — Progress Notes (Signed)
Pt's states no medical or surgical changes since previsit or office visit.   C.W. vital signs. 

## 2020-03-05 NOTE — Patient Instructions (Signed)
Handouts on polyps, hemorrhoids, and diverticulosis given to you today  Await pathology results    YOU HAD AN ENDOSCOPIC PROCEDURE TODAY AT THE  ENDOSCOPY CENTER:   Refer to the procedure report that was given to you for any specific questions about what was found during the examination.  If the procedure report does not answer your questions, please call your gastroenterologist to clarify.  If you requested that your care partner not be given the details of your procedure findings, then the procedure report has been included in a sealed envelope for you to review at your convenience later.  YOU SHOULD EXPECT: Some feelings of bloating in the abdomen. Passage of more gas than usual.  Walking can help get rid of the air that was put into your GI tract during the procedure and reduce the bloating. If you had a lower endoscopy (such as a colonoscopy or flexible sigmoidoscopy) you may notice spotting of blood in your stool or on the toilet paper. If you underwent a bowel prep for your procedure, you may not have a normal bowel movement for a few days.  Please Note:  You might notice some irritation and congestion in your nose or some drainage.  This is from the oxygen used during your procedure.  There is no need for concern and it should clear up in a day or so.  SYMPTOMS TO REPORT IMMEDIATELY:   Following lower endoscopy (colonoscopy or flexible sigmoidoscopy):  Excessive amounts of blood in the stool  Significant tenderness or worsening of abdominal pains  Swelling of the abdomen that is new, acute  Fever of 100F or higher  For urgent or emergent issues, a gastroenterologist can be reached at any hour by calling [phone]. Do not use MyChart messaging for urgent concerns.    DIET:  We do recommend a small meal at first, but then you may proceed to your regular diet.  Drink plenty of fluids but you should avoid alcoholic beverages for 24 hours.  ACTIVITY:  You should plan to take  it easy for the rest of today and you should NOT DRIVE or use heavy machinery until tomorrow (because of the sedation medicines used during the test).    FOLLOW UP: Our staff will call the number listed on your records 48-72 hours following your procedure to check on you and address any questions or concerns that you may have regarding the information given to you following your procedure. If we do not reach you, we will leave a message.  We will attempt to reach you two times.  During this call, we will ask if you have developed any symptoms of COVID 19. If you develop any symptoms (ie: fever, flu-like symptoms, shortness of breath, cough etc.) before then, please call [phone].  If you test positive for Covid 19 in the 2 weeks post procedure, please call and report this information to us.    If any biopsies were taken you will be contacted by phone or by letter within the next 1-3 weeks.  Please call us at [phone] if you have not heard about the biopsies in 3 weeks.    SIGNATURES/CONFIDENTIALITY: You and/or your care partner have signed paperwork which will be entered into your electronic medical record.  These signatures attest to the fact that that the information above on your After Visit Summary has been reviewed and is understood.  Full responsibility of the confidentiality of this discharge information lies with you and/or your care-partner. 

## 2020-03-09 ENCOUNTER — Telehealth: Payer: Self-pay

## 2020-03-09 NOTE — Telephone Encounter (Signed)
Left message on answering machine. 

## 2020-03-09 NOTE — Telephone Encounter (Signed)
Second attempt follow up call to pt, lm on vm. 

## 2020-03-12 ENCOUNTER — Encounter: Payer: Self-pay | Admitting: Gastroenterology

## 2020-11-09 ENCOUNTER — Other Ambulatory Visit: Payer: Self-pay | Admitting: Internal Medicine

## 2020-11-11 NOTE — Telephone Encounter (Signed)
Refill request Omeprazole Last refill 11/14/19 #90/2 Last office visit 11/13/19 No upcoming appointment scheduled

## 2020-11-16 ENCOUNTER — Other Ambulatory Visit: Payer: Self-pay

## 2020-11-16 ENCOUNTER — Emergency Department
Admission: EM | Admit: 2020-11-16 | Discharge: 2020-11-16 | Disposition: A | Discharge status: Home / Self Care | Payer: Managed Care, Other (non HMO) | Attending: Emergency Medicine | Admitting: Emergency Medicine

## 2020-11-16 ENCOUNTER — Emergency Department: Payer: Managed Care, Other (non HMO)

## 2020-11-16 DIAGNOSIS — N200 Calculus of kidney: Secondary | ICD-10-CM

## 2020-11-16 DIAGNOSIS — N202 Calculus of kidney with calculus of ureter: Secondary | ICD-10-CM | POA: Diagnosis not present

## 2020-11-16 DIAGNOSIS — R109 Unspecified abdominal pain: Secondary | ICD-10-CM | POA: Diagnosis present

## 2020-11-16 LAB — COMPREHENSIVE METABOLIC PANEL
ALT: 41 U/L (ref 0–44)
AST: 37 U/L (ref 15–41)
Albumin: 4.5 g/dL (ref 3.5–5.0)
Alkaline Phosphatase: 72 U/L (ref 38–126)
Anion gap: 8 (ref 5–15)
BUN: 18 mg/dL (ref 6–20)
CO2: 22 mmol/L (ref 22–32)
Calcium: 9.4 mg/dL (ref 8.9–10.3)
Chloride: 105 mmol/L (ref 98–111)
Creatinine, Ser: 0.98 mg/dL (ref 0.61–1.24)
GFR, Estimated: >60 mL/min (ref >60)
Glucose, Bld: 131 mg/dL — ABNORMAL HIGH (ref 70–99)
Potassium: 3.8 mmol/L (ref 3.5–5.1)
Sodium: 135 mmol/L (ref 135–145)
Total Bilirubin: 0.9 mg/dL (ref 0.3–1.2)
Total Protein: 7.7 g/dL (ref 6.5–8.1)

## 2020-11-16 LAB — CBC
HCT: 45.9 % (ref 39.0–52.0)
Hemoglobin: 16.5 g/dL (ref 13.0–17.0)
MCH: 32.5 pg (ref 26.0–34.0)
MCHC: 35.9 g/dL (ref 30.0–36.0)
MCV: 90.4 fL (ref 80.0–100.0)
Platelets: 232 10*3/uL (ref 150–400)
RBC: 5.08 MIL/uL (ref 4.22–5.81)
RDW: 12.8 % (ref 11.5–15.5)
WBC: 9.8 10*3/uL (ref 4.0–10.5)
nRBC: 0 % (ref 0.0–0.2)

## 2020-11-16 LAB — URINALYSIS, COMPLETE (UACMP) WITH MICROSCOPIC
Bacteria, UA: NONE SEEN
Bilirubin Urine: NEGATIVE
Glucose, UA: NEGATIVE mg/dL
Ketones, ur: NEGATIVE mg/dL
Leukocytes,Ua: NEGATIVE
Nitrite: NEGATIVE
Protein, ur: NEGATIVE mg/dL
Specific Gravity, Urine: 1.01 (ref 1.005–1.030)
Squamous Epithelial / HPF: NONE SEEN (ref 0–5)
pH: 5 (ref 5.0–8.0)

## 2020-11-16 LAB — LIPASE, BLOOD: Lipase: 23 U/L (ref 11–51)

## 2020-11-16 MED ORDER — SODIUM CHLORIDE 0.9 % IV SOLN
1000.0000 mL | Freq: Once | INTRAVENOUS | Status: AC
  Administered 2020-11-16: 1000 mL via INTRAVENOUS

## 2020-11-16 MED ORDER — ONDANSETRON 4 MG PO TBDP: 4.0000 mg | ORAL_TABLET | Freq: Three times a day (TID) | ORAL | 0 refills | Status: DC | PRN

## 2020-11-16 MED ORDER — OXYCODONE-ACETAMINOPHEN 5-325 MG PO TABS: 1.0000 | ORAL_TABLET | ORAL | 0 refills | Status: DC | PRN

## 2020-11-16 MED ORDER — NAPROXEN 500 MG PO TABS: 500.0000 mg | ORAL_TABLET | Freq: Two times a day (BID) | ORAL | 2 refills | Status: DC

## 2020-11-16 MED ORDER — TAMSULOSIN HCL 0.4 MG PO CAPS: 0.4000 mg | ORAL_CAPSULE | Freq: Every day | ORAL | 0 refills | Status: DC

## 2020-11-16 MED ORDER — ONDANSETRON HCL 4 MG/2ML IJ SOLN
4.0000 mg | Freq: Once | INTRAMUSCULAR | Status: AC
  Administered 2020-11-16: 4 mg via INTRAVENOUS
  Filled 2020-11-16: qty 2

## 2020-11-16 MED ORDER — MORPHINE SULFATE (PF) 4 MG/ML IV SOLN
4.0000 mg | Freq: Once | INTRAVENOUS | Status: AC
  Administered 2020-11-16: 4 mg via INTRAVENOUS
  Filled 2020-11-16: qty 1

## 2020-11-16 MED ORDER — KETOROLAC TROMETHAMINE 30 MG/ML IJ SOLN
30.0000 mg | Freq: Once | INTRAMUSCULAR | Status: AC
  Administered 2020-11-16: 30 mg via INTRAVENOUS
  Filled 2020-11-16: qty 1

## 2020-11-16 NOTE — ED Notes (Signed)
Phlebotomy at bedside to draw labs

## 2020-11-16 NOTE — ED Triage Notes (Signed)
Pt comes with c/o RLQ pain for several weeks. Pt states it had gotten better and subsided but now has gotten worse.  Pt states RLQ pain that radiates to his back. Pt states unable to urinate and blood in urine several weeks back.

## 2020-11-16 NOTE — ED Notes (Signed)
Pt transported to CT ?

## 2020-11-16 NOTE — ED Provider Notes (Signed)
[facility] Emergency Department Provider Note   ____________________________________________    I have reviewed the triage vital signs and the nursing notes.   HISTORY  Chief Complaint RLQ pain     HPI Luis Perez is a 55 y.o. male with history as noted below who presents with complaints of right flank pain.  Patient reports he had the pain yesterday seem to improve, started again today and has been severe.  Has not take anything for this.  No fevers or chills.  He is not diabetic.  No dysuria.  History of kidney stones quite a long time ago.  Mild nausea no vomiting.  Past Medical History:  Diagnosis Date   Chicken pox    GERD (gastroesophageal reflux disease)    Sleep apnea    CPAP use    Patient Active Problem List   Diagnosis Date Noted   GERD (gastroesophageal reflux disease) 11/13/2019    Past Surgical History:  Procedure Laterality Date   FOOT SURGERY     TONSILLECTOMY AND ADENOIDECTOMY  1985    Prior to Admission medications   Medication Sig Start Date End Date Taking? Authorizing Provider  naproxen (NAPROSYN) 500 MG tablet Take 1 tablet (500 mg total) by mouth 2 (two) times daily with a meal. 11/16/20  Yes Lavonia Drafts, MD  ondansetron (ZOFRAN ODT) 4 MG disintegrating tablet Take 1 tablet (4 mg total) by mouth every 8 (eight) hours as needed. 11/16/20  Yes Lavonia Drafts, MD  oxyCODONE-acetaminophen (PERCOCET) 5-325 MG tablet Take 1 tablet by mouth every 4 (four) hours as needed for severe pain. 11/16/20 11/16/21 Yes Lavonia Drafts, MD  tamsulosin (FLOMAX) 0.4 MG CAPS capsule Take 1 capsule (0.4 mg total) by mouth daily. 11/16/20  Yes Lavonia Drafts, MD  meclizine (ANTIVERT) 25 MG tablet Take 1 tablet (25 mg total) by mouth 3 (three) times daily as needed for dizziness. Patient not taking: Reported on 02/24/2020 11/14/19   Jearld Fenton, NP  omeprazole (PRILOSEC) 40 MG capsule TAKE 1 CAPSULE(40 MG) BY MOUTH DAILY 11/11/20   Dutch Quint B, FNP     Allergies Allegra-d [fexofenadine-pseudoephed er]  Family History  Problem Relation Age of Onset   Arthritis Mother    Arthritis Maternal Grandfather    Cancer Neg Hx    Diabetes Neg Hx    Heart disease Neg Hx    Stroke Neg Hx    Esophageal cancer Neg Hx    Rectal cancer Neg Hx    Stomach cancer Neg Hx    Colon cancer Neg Hx    Colon polyps Neg Hx     Social History Social History   Tobacco Use   Smoking status: Never   Smokeless tobacco: Never  Vaping Use   Vaping Use: Never used  Substance Use Topics   Alcohol use: Yes    Alcohol/week: 20.0 standard drinks    Types: 20 Cans of beer per week    Comment: moderate   Drug use: No    Review of Systems  Constitutional: No fever/chills Eyes: No visual changes.  ENT: No sore throat. Cardiovascular: Denies chest pain. Respiratory: Denies shortness of breath. Gastrointestinal: As above Genitourinary: Negative for dysuria.  Positive hematuria Musculoskeletal: Negative for back pain. Skin: Negative for rash. Neurological: Negative for headaches or weakness   ____________________________________________   PHYSICAL EXAM:  VITAL SIGNS: ED Triage Vitals  Enc Vitals Group     BP 11/16/20 1141 (!) 158/91     Pulse Rate  11/16/20 1141 72     Resp 11/16/20 1141 18     Temp 11/16/20 1141 98 F (36.7 C)     Temp src --      SpO2 11/16/20 1141 97 %     Weight --      Height --      Head Circumference --      Peak Flow --      Pain Score 11/16/20 1138 5     Pain Loc --      Pain Edu? --      Excl. in [place]? --     Constitutional: Alert and oriented.   Nose: No congestion/rhinnorhea. Mouth/Throat: Mucous membranes are moist.   Neck:  Painless ROM Cardiovascular: Normal rate, regular rhythm.   Good peripheral circulation. Respiratory: Normal respiratory effort.  No retractions. Gastrointestinal: Soft and nontender. No distention.  No CVA tenderness. Musculoskeletal:  Warm and well  perfused Neurologic:  Normal speech and language. No gross focal neurologic deficits are appreciated.  Skin:  Skin is warm, dry and intact. No rash noted. Psychiatric: Mood and affect are normal. Speech and behavior are normal.  ____________________________________________   LABS (all labs ordered are listed, but only abnormal results are displayed)  Labs Reviewed  URINALYSIS, COMPLETE (UACMP) WITH MICROSCOPIC - Abnormal; Notable for the following components:      Result Value   Color, Urine YELLOW (*)    APPearance CLEAR (*)    Hgb urine dipstick LARGE (*)    All other components within normal limits  COMPREHENSIVE METABOLIC PANEL - Abnormal; Notable for the following components:   Glucose, Bld 131 (*)    All other components within normal limits  CBC  LIPASE, BLOOD   ____________________________________________  EKG None ____________________________________________  RADIOLOGY  CT renal stone study reviewed by me ____________________________________________   PROCEDURES  Procedure(s) performed: No  Procedures   Critical Care performed: No ____________________________________________   INITIAL IMPRESSION / ASSESSMENT AND PLAN / ED COURSE  Pertinent labs & imaging results that were available during my care of the patient were reviewed by me and considered in my medical decision making (see chart for details).   Patient presents with right-sided flank pain as described above, hematuria, highly suspicious for ureterolithiasis, will give IV Toradol, obtain labs urine CT renal stone study and reevaluate  CT scan reviewed by me, demonstrates 2 mm UVJ stone  Patient with some improvement with Toradol, IV morphine given as well.  Urinalysis reassuring  After morphine patient feeling much improved.  Appropriate for discharge with outpatient follow-up as needed, return precautions discussed.    ____________________________________________   FINAL CLINICAL  IMPRESSION(S) / ED DIAGNOSES  Final diagnoses:  Kidney stone        Note:  This document was prepared using Dragon voice recognition software and may include unintentional dictation errors.    Lavonia Drafts, MD 11/16/20 1430

## 2021-01-25 ENCOUNTER — Encounter: Payer: Self-pay | Admitting: Internal Medicine

## 2021-01-26 MED ORDER — SCOPOLAMINE 1 MG/3DAYS TD PT72: 1.0000 | MEDICATED_PATCH | TRANSDERMAL | 0 refills | Status: DC

## 2021-02-08 ENCOUNTER — Other Ambulatory Visit: Payer: Self-pay | Admitting: Family

## 2021-07-22 ENCOUNTER — Encounter: Payer: Self-pay | Admitting: Internal Medicine

## 2021-07-22 ENCOUNTER — Other Ambulatory Visit: Payer: Self-pay | Admitting: Internal Medicine

## 2021-09-21 ENCOUNTER — Encounter: Payer: Self-pay | Admitting: Internal Medicine

## 2021-09-21 ENCOUNTER — Ambulatory Visit (INDEPENDENT_AMBULATORY_CARE_PROVIDER_SITE_OTHER): Payer: Managed Care, Other (non HMO) | Admitting: Internal Medicine

## 2021-09-21 VITALS — BP 126/82 | HR 78 | Temp 97.5°F | Ht 70.5 in | Wt 269.0 lb

## 2021-09-21 DIAGNOSIS — Z0001 Encounter for general adult medical examination with abnormal findings: Secondary | ICD-10-CM

## 2021-09-21 DIAGNOSIS — Z6838 Body mass index (BMI) 38.0-38.9, adult: Secondary | ICD-10-CM

## 2021-09-21 DIAGNOSIS — E6609 Other obesity due to excess calories: Secondary | ICD-10-CM | POA: Insufficient documentation

## 2021-09-21 DIAGNOSIS — Z1159 Encounter for screening for other viral diseases: Secondary | ICD-10-CM

## 2021-09-21 DIAGNOSIS — E66812 Obesity, class 2: Secondary | ICD-10-CM

## 2021-09-21 DIAGNOSIS — E782 Mixed hyperlipidemia: Secondary | ICD-10-CM | POA: Insufficient documentation

## 2021-09-21 MED ORDER — OMEPRAZOLE 40 MG PO CPDR: DELAYED_RELEASE_CAPSULE | ORAL | 1 refills | Status: DC

## 2021-09-21 MED ORDER — SCOPOLAMINE 1 MG/3DAYS TD PT72: 1.0000 | MEDICATED_PATCH | TRANSDERMAL | 0 refills | Status: DC

## 2021-09-21 NOTE — Assessment & Plan Note (Signed)
Encourage diet and exercise for weight loss 

## 2021-09-21 NOTE — Patient Instructions (Signed)
Health Maintenance, Male Adopting a healthy lifestyle and getting preventive care are important in promoting health and wellness. Ask your health care provider about: The right schedule for you to have regular tests and exams. Things you can do on your own to prevent diseases and keep yourself healthy. What should I know about diet, weight, and exercise? Eat a healthy diet  Eat a diet that includes plenty of vegetables, fruits, low-fat dairy products, and lean protein. Do not eat a lot of foods that are high in solid fats, added sugars, or sodium. Maintain a healthy weight Body mass index (BMI) is a measurement that can be used to identify possible weight problems. It estimates body fat based on height and weight. Your health care provider can help determine your BMI and help you achieve or maintain a healthy weight. Get regular exercise Get regular exercise. This is one of the most important things you can do for your health. Most adults should: Exercise for at least 150 minutes each week. The exercise should increase your heart rate and make you sweat (moderate-intensity exercise). Do strengthening exercises at least twice a week. This is in addition to the moderate-intensity exercise. Spend less time sitting. Even light physical activity can be beneficial. Watch cholesterol and blood lipids Have your blood tested for lipids and cholesterol at 54 years of age, then have this test every 5 years. You may need to have your cholesterol levels checked more often if: Your lipid or cholesterol levels are high. You are older than 54 years of age. You are at high risk for heart disease. What should I know about cancer screening? Many types of cancers can be detected early and may often be prevented. Depending on your health history and family history, you may need to have cancer screening at various ages. This may include screening for: Colorectal cancer. Prostate cancer. Skin cancer. Lung  cancer. What should I know about heart disease, diabetes, and high blood pressure? Blood pressure and heart disease High blood pressure causes heart disease and increases the risk of stroke. This is more likely to develop in people who have high blood pressure readings or are overweight. Talk with your health care provider about your target blood pressure readings. Have your blood pressure checked: Every 3-5 years if you are 18-39 years of age. Every year if you are 40 years old or older. If you are between the ages of 65 and 75 and are a current or former smoker, ask your health care provider if you should have a one-time screening for abdominal aortic aneurysm (AAA). Diabetes Have regular diabetes screenings. This checks your fasting blood sugar level. Have the screening done: Once every three years after age 45 if you are at a normal weight and have a low risk for diabetes. More often and at a younger age if you are overweight or have a high risk for diabetes. What should I know about preventing infection? Hepatitis B If you have a higher risk for hepatitis B, you should be screened for this virus. Talk with your health care provider to find out if you are at risk for hepatitis B infection. Hepatitis C Blood testing is recommended for: Everyone born from 1945 through 1965. Anyone with known risk factors for hepatitis C. Sexually transmitted infections (STIs) You should be screened each year for STIs, including gonorrhea and chlamydia, if: You are sexually active and are younger than 54 years of age. You are older than 54 years of age and your   health care provider tells you that you are at risk for this type of infection. Your sexual activity has changed since you were last screened, and you are at increased risk for chlamydia or gonorrhea. Ask your health care provider if you are at risk. Ask your health care provider about whether you are at high risk for HIV. Your health care provider  may recommend a prescription medicine to help prevent HIV infection. If you choose to take medicine to prevent HIV, you should first get tested for HIV. You should then be tested every 3 months for as long as you are taking the medicine. Follow these instructions at home: Alcohol use Do not drink alcohol if your health care provider tells you not to drink. If you drink alcohol: Limit how much you have to 0-2 drinks a day. Know how much alcohol is in your drink. In the [place] one drink equals one 12 oz bottle of beer (355 mL), one 5 oz glass of wine (148 mL), or one 1 oz glass of hard liquor (44 mL). Lifestyle Do not use any products that contain nicotine or tobacco. These products include cigarettes, chewing tobacco, and vaping devices, such as e-cigarettes. If you need help quitting, ask your health care provider. Do not use street drugs. Do not share needles. Ask your health care provider for help if you need support or information about quitting drugs. General instructions Schedule regular health, dental, and eye exams. Stay current with your vaccines. Tell your health care provider if: You often feel depressed. You have ever been abused or do not feel safe at home. Summary Adopting a healthy lifestyle and getting preventive care are important in promoting health and wellness. Follow your health care provider's instructions about healthy diet, exercising, and getting tested or screened for diseases. Follow your health care provider's instructions on monitoring your cholesterol and blood pressure. This information is not intended to replace advice given to you by your health care provider. Make sure you discuss any questions you have with your health care provider. Document Revised: 10/12/2020 Document Reviewed: 10/12/2020 Elsevier Patient Education  2023 [employer]  

## 2021-09-21 NOTE — Progress Notes (Signed)
? ?Subjective:  ? ? Patient ID: Luis Perez, male    DOB: 1966/05/24, 56 y.o.   MRN: [id number] ? ?HPI ? ?Patient presents to clinic today for his annual exam. He would like a refill on Scopolamine patches for a cruise he has coming up. ? ?Flu: 03/2021 ?Tetanus: unsure ?COVID: Pfizer x2 ?Shingrix: Never ?PSA screening: 11/2019 ?Colon screening: 02/2020  ?Vision screening: annually ?Dentist: biannually ? ?Diet: He does eat meat. He consumes fruits and veggies. He does eat some fried foods. He drinks mostly water, coffee. ?Exercise: None ? ?Review of Systems ? ?   ?Past Medical History:  ?Diagnosis Date  ? Chicken pox   ? GERD (gastroesophageal reflux disease)   ? Sleep apnea   ? CPAP use  ? ? ?Current Outpatient Medications  ?Medication Sig Dispense Refill  ? meclizine (ANTIVERT) 25 MG tablet Take 1 tablet (25 mg total) by mouth 3 (three) times daily as needed for dizziness. (Patient not taking: Reported on 02/24/2020) 30 tablet 0  ? naproxen (NAPROSYN) 500 MG tablet Take 1 tablet (500 mg total) by mouth 2 (two) times daily with a meal. 20 tablet 2  ? omeprazole (PRILOSEC) 40 MG capsule TAKE 1 CAPSULE(40 MG) BY MOUTH DAILY 90 capsule 0  ? ondansetron (ZOFRAN ODT) 4 MG disintegrating tablet Take 1 tablet (4 mg total) by mouth every 8 (eight) hours as needed. 20 tablet 0  ? oxyCODONE-acetaminophen (PERCOCET) 5-325 MG tablet Take 1 tablet by mouth every 4 (four) hours as needed for severe pain. 15 tablet 0  ? scopolamine (TRANSDERM-SCOP) 1 MG/3DAYS Place 1 patch (1.5 mg total) onto the skin every 3 (three) days. For motion sickness. Start 2 hr before onset symptoms, up to 12 hr before 3 patch 0  ? tamsulosin (FLOMAX) 0.4 MG CAPS capsule Take 1 capsule (0.4 mg total) by mouth daily. 7 capsule 0  ? ?No current facility-administered medications for this visit.  ? ? ?Allergies  ?Allergen Reactions  ? Allegra-D [Fexofenadine-Pseudoephed Er] Swelling  ?  Swelling of the eyes  ? ? ?Family History  ?Problem Relation Age of  Onset  ? Arthritis Mother   ? Arthritis Maternal Grandfather   ? Cancer Neg Hx   ? Diabetes Neg Hx   ? Heart disease Neg Hx   ? Stroke Neg Hx   ? Esophageal cancer Neg Hx   ? Rectal cancer Neg Hx   ? Stomach cancer Neg Hx   ? Colon cancer Neg Hx   ? Colon polyps Neg Hx   ? ? ?Social History  ? ?Socioeconomic History  ? Marital status: Married  ?  Spouse name: Not on file  ? Number of children: Not on file  ? Years of education: Not on file  ? Highest education level: Not on file  ?Occupational History  ? Not on file  ?Tobacco Use  ? Smoking status: Never  ? Smokeless tobacco: Never  ?Vaping Use  ? Vaping Use: Never used  ?Substance and Sexual Activity  ? Alcohol use: Yes  ?  Alcohol/week: 20.0 standard drinks  ?  Types: 20 Cans of beer per week  ?  Comment: moderate  ? Drug use: No  ? Sexual activity: Yes  ?Other Topics Concern  ? Not on file  ?Social History Narrative  ? Not on file  ? ?Social Determinants of Health  ? ?Financial Resource Strain: Not on file  ?Food Insecurity: Not on file  ?Transportation Needs: Not on file  ?Physical Activity: Not on  file  ?Stress: Not on file  ?Social Connections: Not on file  ?Intimate Partner Violence: Not on file  ? ? ? ?Constitutional: Denies fever, malaise, fatigue, headache or abrupt weight changes.  ?HEENT: Denies eye pain, eye redness, ear pain, ringing in the ears, wax buildup, runny nose, nasal congestion, bloody nose, or sore throat. ?Respiratory: Denies difficulty breathing, shortness of breath, cough or sputum production.   ?Cardiovascular: Denies chest pain, chest tightness, palpitations or swelling in the hands or feet.  ?Gastrointestinal: Pt reports reflux. Denies abdominal pain, bloating, constipation, diarrhea or blood in the stool.  ?GU: Denies urgency, frequency, pain with urination, burning sensation, blood in urine, odor or discharge. ?Musculoskeletal: Pt reports intermittent right  sided neck pain. Denies decrease in range of motion, difficulty with gait,  or joint pain and swelling.  ?Skin: Denies redness, rashes, lesions or ulcercations.  ?Neurological: Pt reports intermittent numbness in hands. Denies dizziness, difficulty with memory, difficulty with speech or problems with balance and coordination.  ?Psych: Denies anxiety, depression, SI/HI. ? ?No other specific complaints in a complete review of systems (except as listed in HPI above). ? ?Objective:  ? Physical Exam ? ? ?BP 126/82 (BP Location: Left Arm, Patient Position: Sitting, Cuff Size: Large)   Pulse 78   Temp (!) 97.5 ?F (36.4 ?C) (Temporal)   Ht 5' 10.5" (1.791 m)   Wt 269 lb (122 kg)   SpO2 98%   BMI 38.05 kg/m?  ? ?Wt Readings from Last 3 Encounters:  ?03/05/20 251 lb (113.9 kg)  ?02/24/20 251 lb (113.9 kg)  ?11/13/19 255 lb (115.7 kg)  ? ? ?General: Appears his stated age, obese, in NAD. ?Skin: Warm, dry and intact.  ?HEENT: Head: normal shape and size; Eyes: sclera white, no icterus, conjunctiva pink, PERRLA and EOMs intact;  ?Neck:  Neck supple, trachea midline. No masses, lumps or thyromegaly present.  ?Cardiovascular: Normal rate and rhythm. S1,S2 noted.  No murmur, rubs or gallops noted. No JVD or BLE edema. No carotid bruits noted. ?Pulmonary/Chest: Normal effort and positive vesicular breath sounds. No respiratory distress. No wheezes, rales or ronchi noted.  ?Abdomen: Soft and nontender. Normal bowel sounds. ?Musculoskeletal: Normal flexion, extension, rotation and lateral bending of the cervical spine.  Strength 5/5 BUE/BLE.  No difficulty with gait.  ?Neurological: Alert and oriented. Cranial nerves II-XII grossly intact. + Tinels on the left. + Phalen bilaterally. Coordination normal.  ?Psychiatric: Mood and affect normal. Behavior is normal. Judgment and thought content normal.  ? ? ?BMET ?   ?Component Value Date/Time  ? NA 135 11/16/2020 1244  ? NA 140 11/13/2019 1635  ? K 3.8 11/16/2020 1244  ? CL 105 11/16/2020 1244  ? CO2 22 11/16/2020 1244  ? GLUCOSE 131 (H) 11/16/2020 1244  ?  BUN 18 11/16/2020 1244  ? BUN 15 11/13/2019 1635  ? CREATININE 0.98 11/16/2020 1244  ? CALCIUM 9.4 11/16/2020 1244  ? GFRNONAA >60 11/16/2020 1244  ? GFRAA 118 11/13/2019 1635  ? ? ?Lipid Panel  ?   ?Component Value Date/Time  ? CHOL 215 (H) 11/13/2019 1635  ? TRIG 186 (H) 11/13/2019 1635  ? HDL 48 11/13/2019 1635  ? CHOLHDL 4.5 11/13/2019 1635  ? LDLCALC 134 (H) 11/13/2019 1635  ? ? ?CBC ?   ?Component Value Date/Time  ? WBC 9.8 11/16/2020 1141  ? RBC 5.08 11/16/2020 1141  ? HGB 16.5 11/16/2020 1141  ? HGB 15.7 11/13/2019 1635  ? HCT 45.9 11/16/2020 1141  ? HCT 45.1 11/13/2019  1635  ? PLT 232 11/16/2020 1141  ? PLT 236 11/13/2019 1635  ? MCV 90.4 11/16/2020 1141  ? MCV 92 11/13/2019 1635  ? MCH 32.5 11/16/2020 1141  ? MCHC 35.9 11/16/2020 1141  ? RDW 12.8 11/16/2020 1141  ? RDW 13.1 11/13/2019 1635  ? ? ?Hgb A1C ?Lab Results  ?Component Value Date  ? HGBA1C 5.2 11/13/2019  ? ? ? ? ? ? ?   ?Assessment & Plan:  ? ?Preventative Health Maintenance: ? ?Encouraged him to get a flu shot in the fall ?He declines tetanus today ?Encouraged him to get his COVID booster ?Discussed Shingrix vaccine, he will check coverage with his insurance company and schedule a nurse visit if he would like to have this done ?Colon screening UTD ?Encouraged him to consume a balanced diet and exercise regimen ?Advised him to see an eye doctor and dentist annually ?We will check CBC, c-Met, lipid, A1c and hep C today ? ?RTC in 6 months, follow-up chronic conditions ?Webb Silversmith, NP ? ?

## 2021-09-24 LAB — CBC
Hematocrit: 48.1 % (ref 37.5–51.0)
Hemoglobin: 16.9 g/dL (ref 13.0–17.7)
MCH: 31.9 pg (ref 26.6–33.0)
MCHC: 35.1 g/dL (ref 31.5–35.7)
MCV: 91 fL (ref 79–97)
Platelets: 248 10*3/uL (ref 150–450)
RBC: 5.29 x10E6/uL (ref 4.14–5.80)
RDW: 13.1 % (ref 11.6–15.4)
WBC: 4.7 10*3/uL (ref 3.4–10.8)

## 2021-09-24 LAB — COMPREHENSIVE METABOLIC PANEL WITH GFR
ALT: 48 IU/L — ABNORMAL HIGH (ref 0–44)
AST: 28 IU/L (ref 0–40)
Albumin/Globulin Ratio: 1.7 (ref 1.2–2.2)
Albumin: 4.5 g/dL (ref 3.8–4.9)
Alkaline Phosphatase: 90 IU/L (ref 44–121)
BUN/Creatinine Ratio: 15 (ref 9–20)
BUN: 14 mg/dL (ref 6–24)
Bilirubin Total: 0.4 mg/dL (ref 0.0–1.2)
CO2: 24 mmol/L (ref 20–29)
Calcium: 9.4 mg/dL (ref 8.7–10.2)
Chloride: 104 mmol/L (ref 96–106)
Creatinine, Ser: 0.96 mg/dL (ref 0.76–1.27)
Globulin, Total: 2.7 g/dL (ref 1.5–4.5)
Glucose: 105 mg/dL — ABNORMAL HIGH (ref 70–99)
Potassium: 4.3 mmol/L (ref 3.5–5.2)
Sodium: 140 mmol/L (ref 134–144)
Total Protein: 7.2 g/dL (ref 6.0–8.5)
eGFR: 95 mL/min/1.73 (ref >59)

## 2021-09-24 LAB — LIPID PANEL
Chol/HDL Ratio: 4.4 ratio (ref 0.0–5.0)
Cholesterol, Total: 219 mg/dL — ABNORMAL HIGH (ref 100–199)
HDL: 50 mg/dL (ref >39)
LDL Chol Calc (NIH): 136 mg/dL — ABNORMAL HIGH (ref 0–99)
Triglycerides: 185 mg/dL — ABNORMAL HIGH (ref 0–149)
VLDL Cholesterol Cal: 33 mg/dL (ref 5–40)

## 2021-09-24 LAB — HEPATITIS C ANTIBODY: Hep C Virus Ab: NONREACTIVE

## 2021-09-24 LAB — HEMOGLOBIN A1C
Est. average glucose Bld gHb Est-mCnc: 105 mg/dL
Hgb A1c MFr Bld: 5.3 % (ref 4.8–5.6)

## 2021-09-26 ENCOUNTER — Encounter: Payer: Self-pay | Admitting: Internal Medicine

## 2021-09-26 DIAGNOSIS — E782 Mixed hyperlipidemia: Secondary | ICD-10-CM

## 2021-09-27 MED ORDER — LOVASTATIN 10 MG PO TABS: 10.0000 mg | ORAL_TABLET | Freq: Every day | ORAL | 2 refills | Status: DC

## 2021-09-27 NOTE — Addendum Note (Signed)
Addended by: Wilson Singer on: 09/27/2021 04:31 PM ? ? Modules accepted: Orders ? ?

## 2021-12-24 ENCOUNTER — Other Ambulatory Visit: Payer: Self-pay | Admitting: Internal Medicine

## 2021-12-24 NOTE — Telephone Encounter (Signed)
Requested Prescriptions  Pending Prescriptions Disp Refills  . lovastatin (MEVACOR) 10 MG tablet [Pharmacy Med Name: LOVASTATIN '10MG'$  TABLETS] 30 tablet 2    Sig: TAKE 1 TABLET(10 MG) BY MOUTH AT BEDTIME     Cardiovascular:  Antilipid - Statins 2 Failed - 12/24/2021  3:31 AM      Failed - Lipid Panel in normal range within the last 12 months    Cholesterol, Total  Date Value Ref Range Status  09/23/2021 219 (H) 100 - 199 mg/dL Final   LDL Chol Calc (NIH)  Date Value Ref Range Status  09/23/2021 136 (H) 0 - 99 mg/dL Final   HDL  Date Value Ref Range Status  09/23/2021 50 >39 mg/dL Final   Triglycerides  Date Value Ref Range Status  09/23/2021 185 (H) 0 - 149 mg/dL Final         Passed - Cr in normal range and within 360 days    Creatinine, Ser  Date Value Ref Range Status  09/23/2021 0.96 0.76 - 1.27 mg/dL Final         Passed - Patient is not pregnant      Passed - Valid encounter within last 12 months    Recent Outpatient Visits          3 months ago Encounter for general adult medical examination with abnormal findings   [place], NP      Future Appointments            In 3 months Baity, Coralie Keens, NP [facility], [facility]

## 2021-12-27 ENCOUNTER — Encounter: Payer: Self-pay | Admitting: Internal Medicine

## 2021-12-27 ENCOUNTER — Other Ambulatory Visit: Payer: Self-pay | Admitting: Internal Medicine

## 2021-12-27 DIAGNOSIS — E782 Mixed hyperlipidemia: Secondary | ICD-10-CM

## 2021-12-28 LAB — LIPID PANEL
Chol/HDL Ratio: 2.9 ratio (ref 0.0–5.0)
Cholesterol, Total: 167 mg/dL (ref 100–199)
HDL: 57 mg/dL (ref >39)
LDL Chol Calc (NIH): 89 mg/dL (ref 0–99)
Triglycerides: 116 mg/dL (ref 0–149)
VLDL Cholesterol Cal: 21 mg/dL (ref 5–40)

## 2022-03-21 ENCOUNTER — Other Ambulatory Visit: Payer: Self-pay | Admitting: Internal Medicine

## 2022-03-22 NOTE — Telephone Encounter (Signed)
Requested Prescriptions  Pending Prescriptions Disp Refills  . omeprazole (PRILOSEC) 40 MG capsule [Pharmacy Med Name: OMEPRAZOLE '40MG'$  CAPSULES] 90 capsule 1    Sig: TAKE 1 CAPSULE(40 MG) BY MOUTH DAILY     Gastroenterology: Proton Pump Inhibitors Passed - 03/21/2022  3:30 AM      Passed - Valid encounter within last 12 months    Recent Outpatient Visits          6 months ago Encounter for general adult medical examination with abnormal findings   [facility], Coralie Keens, NP      Future Appointments            In 2 days Gilliam, Coralie Keens, NP [facility], [place]           . lovastatin (MEVACOR) 10 MG tablet [Pharmacy Med Name: LOVASTATIN '10MG'$  TABLETS] 90 tablet 1    Sig: TAKE 1 TABLET(10 MG) BY MOUTH AT BEDTIME     Cardiovascular:  Antilipid - Statins 2 Failed - 03/21/2022  3:30 AM      Failed - Lipid Panel in normal range within the last 12 months    Cholesterol, Total  Date Value Ref Range Status  12/27/2021 167 100 - 199 mg/dL Final   LDL Chol Calc (NIH)  Date Value Ref Range Status  12/27/2021 89 0 - 99 mg/dL Final   HDL  Date Value Ref Range Status  12/27/2021 57 >39 mg/dL Final   Triglycerides  Date Value Ref Range Status  12/27/2021 116 0 - 149 mg/dL Final         Passed - Cr in normal range and within 360 days    Creatinine, Ser  Date Value Ref Range Status  09/23/2021 0.96 0.76 - 1.27 mg/dL Final         Passed - Patient is not pregnant      Passed - Valid encounter within last 12 months    Recent Outpatient Visits          6 months ago Encounter for general adult medical examination with abnormal findings   [place], NP      Future Appointments            In 2 days Garnette Gunner, Coralie Keens, NP [facility], [facility]

## 2022-03-24 ENCOUNTER — Ambulatory Visit: Payer: Managed Care, Other (non HMO) | Admitting: Internal Medicine

## 2022-03-24 ENCOUNTER — Encounter: Payer: Self-pay | Admitting: Internal Medicine

## 2022-03-24 DIAGNOSIS — Z6837 Body mass index (BMI) 37.0-37.9, adult: Secondary | ICD-10-CM

## 2022-03-24 DIAGNOSIS — N529 Male erectile dysfunction, unspecified: Secondary | ICD-10-CM | POA: Diagnosis not present

## 2022-03-24 DIAGNOSIS — K219 Gastro-esophageal reflux disease without esophagitis: Secondary | ICD-10-CM | POA: Diagnosis not present

## 2022-03-24 DIAGNOSIS — E782 Mixed hyperlipidemia: Secondary | ICD-10-CM | POA: Diagnosis not present

## 2022-03-24 MED ORDER — SILDENAFIL CITRATE 20 MG PO TABS: ORAL_TABLET | ORAL | 5 refills | Status: DC

## 2022-03-24 NOTE — Assessment & Plan Note (Signed)
Will trial Sildenafil 

## 2022-03-24 NOTE — Patient Instructions (Signed)

## 2022-03-24 NOTE — Progress Notes (Signed)
Subjective:    Patient ID: Luis Perez, male    DOB: 1966/01/20, 56 y.o.   MRN: [id number]  HPI  Patient presents to the clinic today for follow-up of chronic conditions.  GERD: Triggered by tomato based foods.  He denies breakthrough on Omeprazole.  There is no upper GI on file.  HLD: His last LDL was 89, triglycerides 116, 12/2021.  He denies myalgias on Lovastatin.  He tries to consume low-fat diet.  Review of Systems     Past Medical History:  Diagnosis Date   Chicken pox    GERD (gastroesophageal reflux disease)    Sleep apnea    CPAP use    Current Outpatient Medications  Medication Sig Dispense Refill   lovastatin (MEVACOR) 10 MG tablet TAKE 1 TABLET(10 MG) BY MOUTH AT BEDTIME 90 tablet 1   meclizine (ANTIVERT) 25 MG tablet Take 1 tablet (25 mg total) by mouth 3 (three) times daily as needed for dizziness. 30 tablet 0   naproxen (NAPROSYN) 500 MG tablet Take 1 tablet (500 mg total) by mouth 2 (two) times daily with a meal. 20 tablet 2   omeprazole (PRILOSEC) 40 MG capsule TAKE 1 CAPSULE(40 MG) BY MOUTH DAILY 90 capsule 1   scopolamine (TRANSDERM-SCOP) 1 MG/3DAYS Place 1 patch (1.5 mg total) onto the skin every 3 (three) days. For motion sickness. Start 2 hr before onset symptoms, up to 12 hr before 10 patch 0   No current facility-administered medications for this visit.    Allergies  Allergen Reactions   Allegra-D [Fexofenadine-Pseudoephed Er] Swelling    Swelling of the eyes    Family History  Problem Relation Age of Onset   Arthritis Mother    Arthritis Maternal Grandfather    Cancer Neg Hx    Diabetes Neg Hx    Heart disease Neg Hx    Stroke Neg Hx    Esophageal cancer Neg Hx    Rectal cancer Neg Hx    Stomach cancer Neg Hx    Colon cancer Neg Hx    Colon polyps Neg Hx     Social History   Socioeconomic History   Marital status: Married    Spouse name: Not on file   Number of children: Not on file   Years of education: Not on file    Highest education level: Not on file  Occupational History   Not on file  Tobacco Use   Smoking status: Never   Smokeless tobacco: Never  Vaping Use   Vaping Use: Never used  Substance and Sexual Activity   Alcohol use: Yes    Alcohol/week: 20.0 standard drinks of alcohol    Types: 20 Cans of beer per week    Comment: moderate   Drug use: No   Sexual activity: Yes  Other Topics Concern   Not on file  Social History Narrative   Not on file   Social Determinants of Health   Financial Resource Strain: Not on file  Food Insecurity: Not on file  Transportation Needs: Not on file  Physical Activity: Not on file  Stress: Not on file  Social Connections: Not on file  Intimate Partner Violence: Not on file     Constitutional: Denies fever, malaise, fatigue, headache or abrupt weight changes.  HEENT: Denies eye pain, eye redness, ear pain, ringing in the ears, wax buildup, runny nose, nasal congestion, bloody nose, or sore throat. Respiratory: Denies difficulty breathing, shortness of breath, cough or sputum production.   Cardiovascular:  Denies chest pain, chest tightness, palpitations or swelling in the hands or feet.  Gastrointestinal: Denies abdominal pain, bloating, constipation, diarrhea or blood in the stool.  GU: Pt reports erectile dysfunction. Denies urgency, frequency, pain with urination, burning sensation, blood in urine, odor or discharge. Musculoskeletal: Denies decrease in range of motion, difficulty with gait, muscle pain or joint pain and swelling.  Skin: Denies redness, rashes, lesions or ulcercations.  Neurological: Denies dizziness, difficulty with memory, difficulty with speech or problems with balance and coordination.  Psych: Denies anxiety, depression, SI/HI.  No other specific complaints in a complete review of systems (except as listed in HPI above).  Objective:   Physical Exam   BP 134/84 (BP Location: Right Arm, Patient Position: Sitting, Cuff Size:  Large)   Pulse 79   Temp (!) 96.9 F (36.1 C) (Temporal)   Wt 265 lb (120.2 kg)   SpO2 99%   BMI 37.49 kg/m   Wt Readings from Last 3 Encounters:  09/21/21 269 lb (122 kg)  03/05/20 251 lb (113.9 kg)  02/24/20 251 lb (113.9 kg)    General: Appears her stated age, obese, in NAD. Cardiovascular: Normal rate and rhythm. S1,S2 noted.  No murmur, rubs or gallops noted. No JVD or BLE edema. No carotid bruits noted. Pulmonary/Chest: Normal effort and positive vesicular breath sounds. No respiratory distress. No wheezes, rales or ronchi noted.  Abdomen: Normal bowel sounds.  Musculoskeletal: Normal range of motion. No signs of joint swelling. No difficulty with gait.  Neurological: Alert and oriented. Cranial nerves II-XII grossly intact. Coordination normal.  Psychiatric: Mood and affect normal. Behavior is normal. Judgment and thought content normal.    BMET    Component Value Date/Time   NA 140 09/23/2021 1440   K 4.3 09/23/2021 1440   CL 104 09/23/2021 1440   CO2 24 09/23/2021 1440   GLUCOSE 105 (H) 09/23/2021 1440   GLUCOSE 131 (H) 11/16/2020 1244   BUN 14 09/23/2021 1440   CREATININE 0.96 09/23/2021 1440   CALCIUM 9.4 09/23/2021 1440   GFRNONAA >60 11/16/2020 1244   GFRAA 118 11/13/2019 1635    Lipid Panel     Component Value Date/Time   CHOL 167 12/27/2021 1453   TRIG 116 12/27/2021 1453   HDL 57 12/27/2021 1453   CHOLHDL 2.9 12/27/2021 1453   LDLCALC 89 12/27/2021 1453    CBC    Component Value Date/Time   WBC 4.7 09/23/2021 1440   WBC 9.8 11/16/2020 1141   RBC 5.29 09/23/2021 1440   RBC 5.08 11/16/2020 1141   HGB 16.9 09/23/2021 1440   HCT 48.1 09/23/2021 1440   PLT 248 09/23/2021 1440   MCV 91 09/23/2021 1440   MCH 31.9 09/23/2021 1440   MCH 32.5 11/16/2020 1141   MCHC 35.1 09/23/2021 1440   MCHC 35.9 11/16/2020 1141   RDW 13.1 09/23/2021 1440    Hgb A1C Lab Results  Component Value Date   HGBA1C 5.3 09/23/2021           Assessment &  Plan:     RTC in 6 months for your annual exam Webb Silversmith, NP

## 2022-03-24 NOTE — Assessment & Plan Note (Signed)
Avoid foods that trigger your reflux Encouraged weight loss as this can help reduce reflux symptoms Continue Omeprazole 

## 2022-03-24 NOTE — Assessment & Plan Note (Signed)
Encouraged him to consume a low fat diet Continue lovastatin

## 2022-03-24 NOTE — Assessment & Plan Note (Signed)
Encouraged diet and exercise for weight loss ?

## 2022-04-20 ENCOUNTER — Encounter: Payer: Self-pay | Admitting: Internal Medicine

## 2022-05-06 ENCOUNTER — Encounter: Payer: Self-pay | Admitting: Internal Medicine

## 2022-06-10 ENCOUNTER — Encounter: Payer: Self-pay | Admitting: Internal Medicine

## 2022-09-18 ENCOUNTER — Other Ambulatory Visit: Payer: Self-pay | Admitting: Internal Medicine

## 2022-09-19 NOTE — Telephone Encounter (Signed)
Requested Prescriptions  Pending Prescriptions Disp Refills   lovastatin (MEVACOR) 10 MG tablet [Pharmacy Med Name: LOVASTATIN 10MG  TABLETS] 90 tablet 1    Sig: TAKE 1 TABLET(10 MG) BY MOUTH AT BEDTIME     Cardiovascular:  Antilipid - Statins 2 Failed - 09/18/2022  3:31 AM      Failed - Lipid Panel in normal range within the last 12 months    Cholesterol, Total  Date Value Ref Range Status  12/27/2021 167 100 - 199 mg/dL Final   LDL Chol Calc (NIH)  Date Value Ref Range Status  12/27/2021 89 0 - 99 mg/dL Final   HDL  Date Value Ref Range Status  12/27/2021 57 >39 mg/dL Final   Triglycerides  Date Value Ref Range Status  12/27/2021 116 0 - 149 mg/dL Final         Passed - Cr in normal range and within 360 days    Creatinine, Ser  Date Value Ref Range Status  09/23/2021 0.96 0.76 - 1.27 mg/dL Final         Passed - Patient is not pregnant      Passed - Valid encounter within last 12 months    Recent Outpatient Visits           5 months ago Class 2 severe obesity due to excess calories with serious comorbidity and body mass index (BMI) of 37.0 to 37.9 in adult [facility])   [place] [facility], Salvadore Oxford, NP   12 months ago Encounter for general adult medical examination with abnormal findings   [place] [facility] [place], [place], NP               omeprazole (PRILOSEC) 40 MG capsule [Pharmacy Med Name: OMEPRAZOLE 40MG  CAPSULES] 90 capsule 1    Sig: TAKE 1 CAPSULE(40 MG) BY MOUTH DAILY     Gastroenterology: Proton Pump Inhibitors Passed - 09/18/2022  3:31 AM      Passed - Valid encounter within last 12 months    Recent Outpatient Visits           5 months ago Class 2 severe obesity due to excess calories with serious comorbidity and body mass index (BMI) of 37.0 to 37.9 in adult [facility])   [place] [facility], Salvadore Oxford, NP   12 months ago Encounter for general adult medical examination with abnormal  findings   [place] [facility] [place], Salvadore Oxford, NP

## 2022-11-23 IMAGING — CT CT RENAL STONE PROTOCOL
2 of 4 series · 17 of 46 positions shown, 19 images · non-contrast
Comparison: 02/10/2016

CLINICAL DATA: Right lower quadrant abdominal pain over the last
several weeks, waxing and waning.

EXAM:
CT ABDOMEN AND PELVIS WITHOUT CONTRAST
TECHNIQUE: Multidetector CT imaging of the abdomen and pelvis was performed
following the standard protocol without IV contrast.

[Series 2: stone full standard · axial · 0.94mm/px · z∈[-874,-404]mm · 14 of 104 slices shown, 16 images]
[im 5/104  soft-tissue]
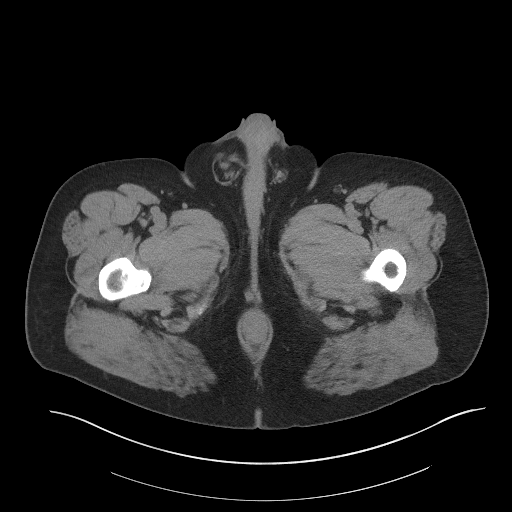
[im 5/104  bone]
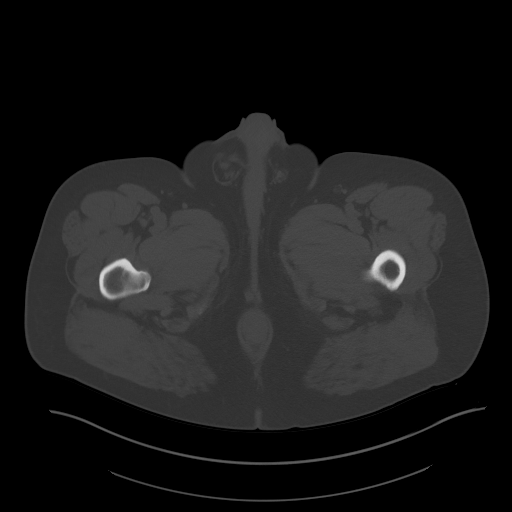
[im 14/104  soft-tissue]
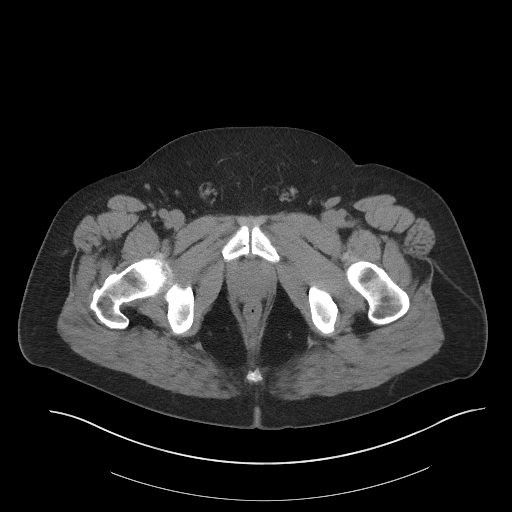
[im 18/104  soft-tissue]
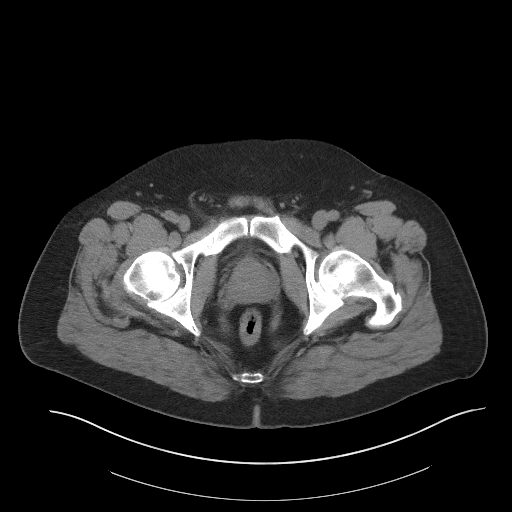
[im 27/104  soft-tissue]
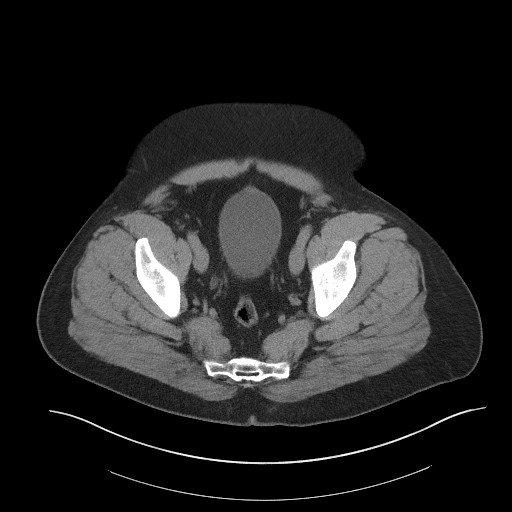
[im 36/104  soft-tissue]
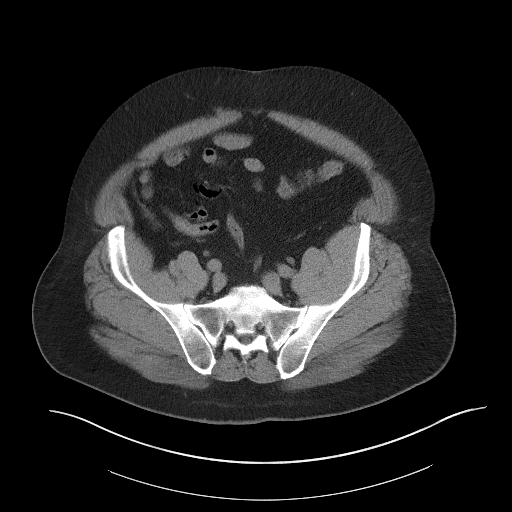
[im 41/104  soft-tissue]
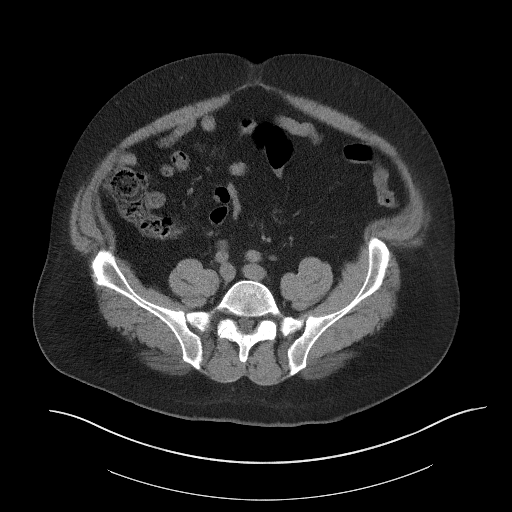
[im 50/104  soft-tissue]
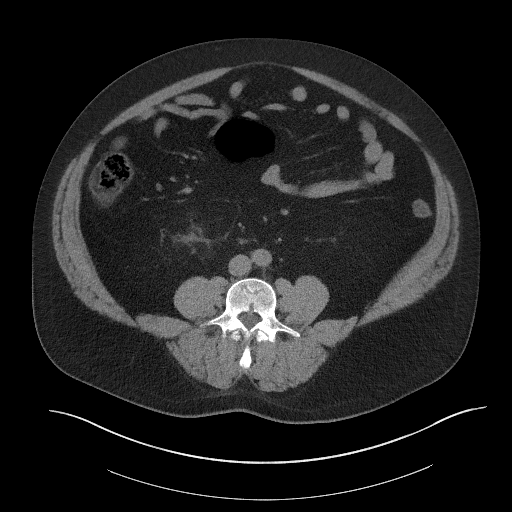
[im 54/104  soft-tissue]
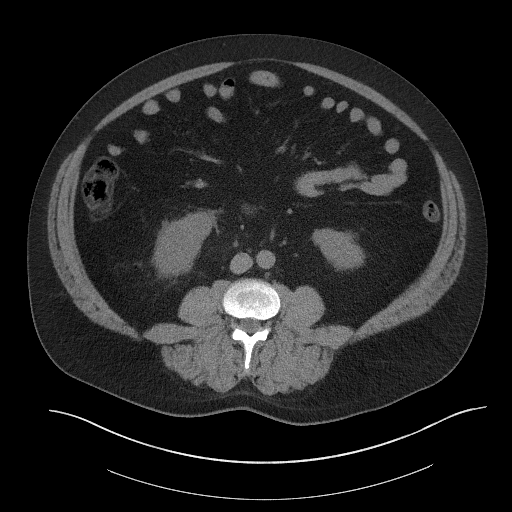
[im 63/104  soft-tissue]
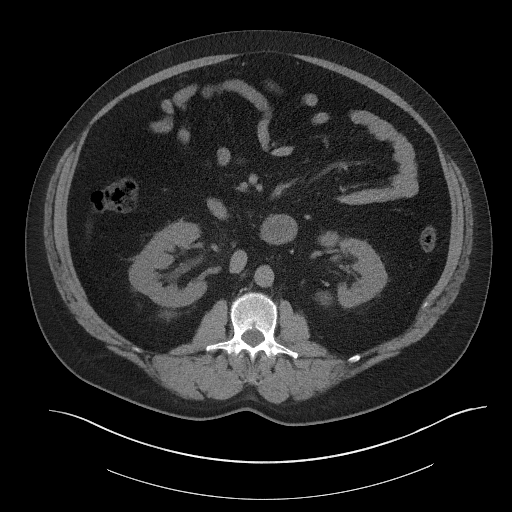
[im 63/104  bone]
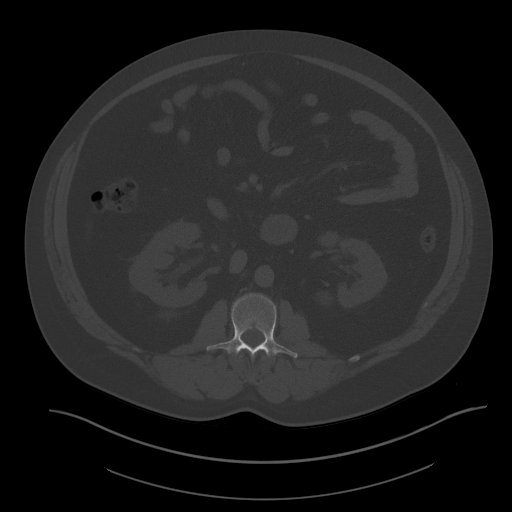
[im 68/104  soft-tissue]
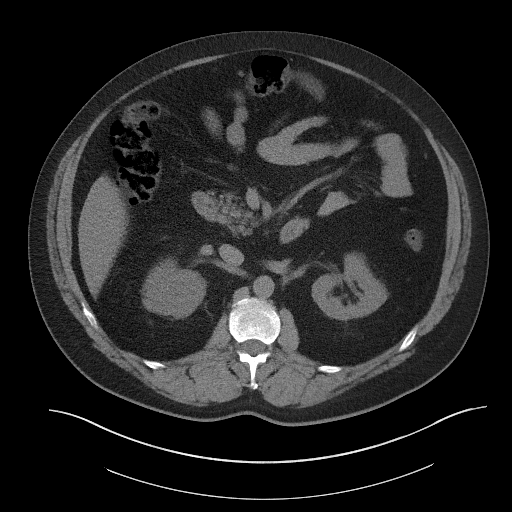
[im 77/104  soft-tissue]
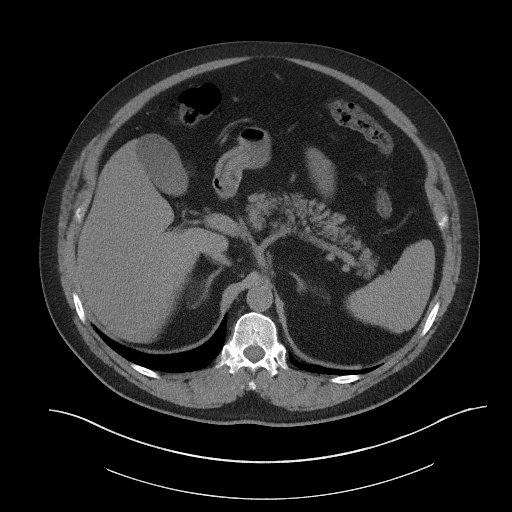
[im 86/104  soft-tissue]
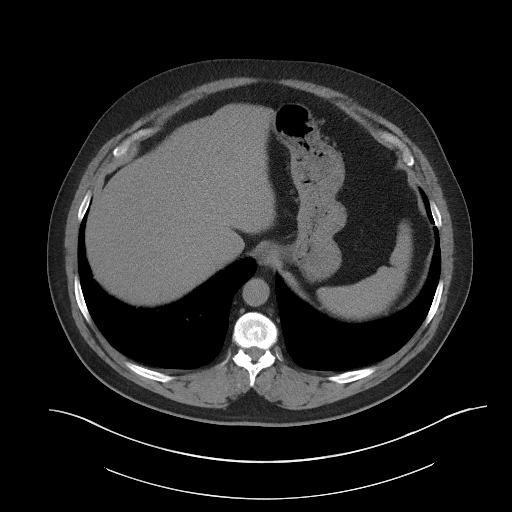
[im 90/104  soft-tissue]
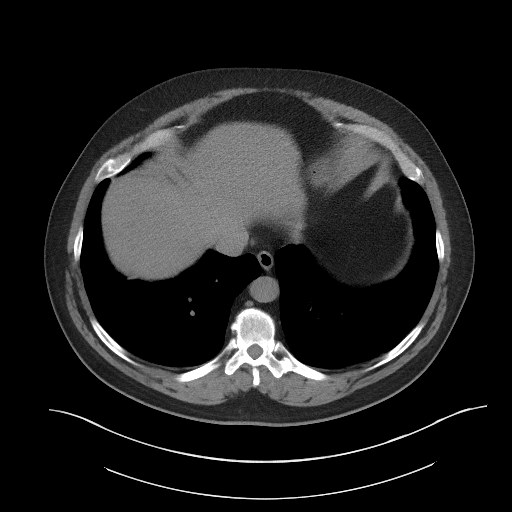
[im 99/104  soft-tissue]
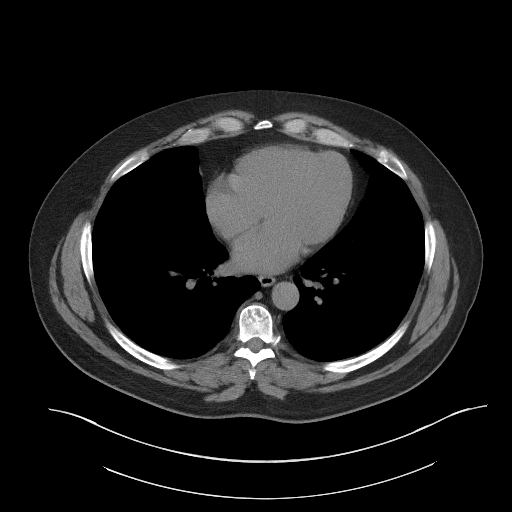

[Series 5: coronal · coronal · 0.91mm/px · 3 of 184 slices shown]
[im 62/184  soft-tissue]
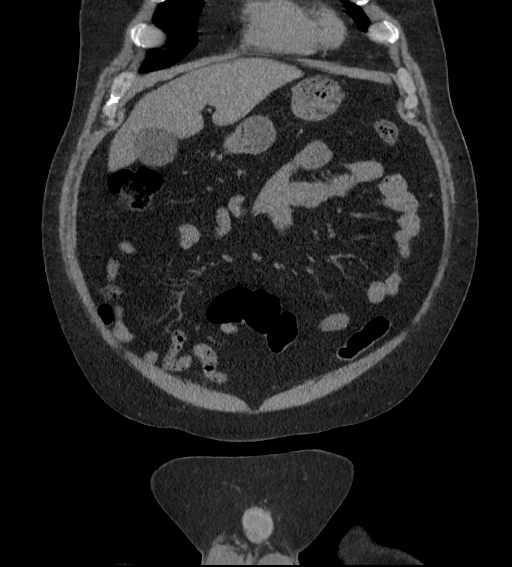
[im 82/184  soft-tissue]
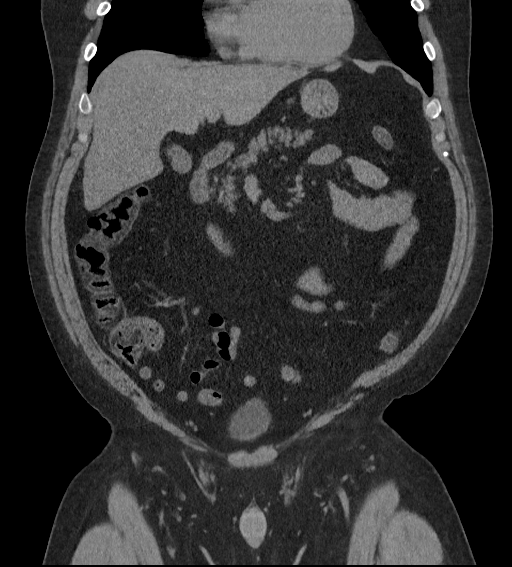
[im 102/184  soft-tissue]
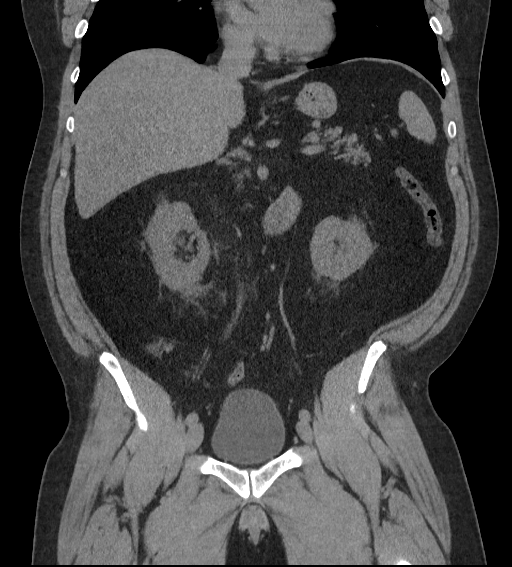

[17 of 46 positions shown; findings below may reference images not displayed]

FINDINGS: Lower chest: Normal

Hepatobiliary: Normal

Pancreas: Normal

Spleen: Normal

Adrenals/Urinary Tract: Adrenal glands are normal. Left kidney is
normal. No cyst, mass, stone or hydronephrosis. Right kidney shows
mild surrounding edema with mild fullness of the renal collecting
system and ureter all the way to the UVJ where there is a 2 mm
stone. No stone in the bladder.

Stomach/Bowel: Normal

Vascular/Lymphatic: Normal

Reproductive: Normal

Other: No free fluid or air.

Musculoskeletal: Normal
IMPRESSION: 2 mm stone at the right UVJ with mild fullness of the right renal
collecting system and ureter.

Otherwise normal examination.

## 2022-12-25 ENCOUNTER — Encounter: Payer: Self-pay | Admitting: Internal Medicine

## 2023-01-24 ENCOUNTER — Encounter: Payer: Self-pay | Admitting: Internal Medicine

## 2023-03-07 ENCOUNTER — Encounter: Payer: Self-pay | Admitting: Internal Medicine

## 2023-03-07 ENCOUNTER — Ambulatory Visit: Payer: Managed Care, Other (non HMO) | Admitting: Internal Medicine

## 2023-03-07 VITALS — BP 114/82 | HR 83 | Temp 96.6°F | Ht 70.0 in | Wt 254.0 lb

## 2023-03-07 DIAGNOSIS — Z8249 Family history of ischemic heart disease and other diseases of the circulatory system: Secondary | ICD-10-CM

## 2023-03-07 DIAGNOSIS — Z0001 Encounter for general adult medical examination with abnormal findings: Secondary | ICD-10-CM

## 2023-03-07 DIAGNOSIS — E66812 Obesity, class 2: Secondary | ICD-10-CM

## 2023-03-07 DIAGNOSIS — E782 Mixed hyperlipidemia: Secondary | ICD-10-CM | POA: Diagnosis not present

## 2023-03-07 DIAGNOSIS — Z1211 Encounter for screening for malignant neoplasm of colon: Secondary | ICD-10-CM

## 2023-03-07 DIAGNOSIS — R739 Hyperglycemia, unspecified: Secondary | ICD-10-CM | POA: Diagnosis not present

## 2023-03-07 DIAGNOSIS — Z125 Encounter for screening for malignant neoplasm of prostate: Secondary | ICD-10-CM

## 2023-03-07 DIAGNOSIS — Z6836 Body mass index (BMI) 36.0-36.9, adult: Secondary | ICD-10-CM

## 2023-03-07 NOTE — Progress Notes (Signed)
Subjective:    Patient ID: Luis Perez, male    DOB: 06/06/1966, 57 y.o.   MRN: [id number]  HPI  Patient presents to clinic today for his annual exam.  Flu: 03/2022 Tetanus: unsure COVID: X 4 Shingrix: Never PSA screening: 11/2019 Colon screening: 02/2020 Vision screening: annually Dentist: biannually  Diet: He does eat meat. He consumes fruits and veggies. He does eat some fried foods. He drinks mostly water. Exercise: Walking  Review of Systems     Past Medical History:  Diagnosis Date   Chicken pox    GERD (gastroesophageal reflux disease)    Sleep apnea    CPAP use    Current Outpatient Medications  Medication Sig Dispense Refill   lovastatin (MEVACOR) 10 MG tablet TAKE 1 TABLET(10 MG) BY MOUTH AT BEDTIME 90 tablet 1   meclizine (ANTIVERT) 25 MG tablet Take 1 tablet (25 mg total) by mouth 3 (three) times daily as needed for dizziness. 30 tablet 0   naproxen (NAPROSYN) 500 MG tablet Take 1 tablet (500 mg total) by mouth 2 (two) times daily with a meal. 20 tablet 2   omeprazole (PRILOSEC) 40 MG capsule TAKE 1 CAPSULE(40 MG) BY MOUTH DAILY 90 capsule 1   sildenafil (REVATIO) 20 MG tablet Take 1-5 pills about 30 min prior to sex. Start with 1 and increase as needed. 12 tablet 5   No current facility-administered medications for this visit.    Allergies  Allergen Reactions   Allegra-D [Fexofenadine-Pseudoephed Er] Swelling    Swelling of the eyes    Family History  Problem Relation Age of Onset   Arthritis Mother    Arthritis Maternal Grandfather    Cancer Neg Hx    Diabetes Neg Hx    Heart disease Neg Hx    Stroke Neg Hx    Esophageal cancer Neg Hx    Rectal cancer Neg Hx    Stomach cancer Neg Hx    Colon cancer Neg Hx    Colon polyps Neg Hx     Social History   Socioeconomic History   Marital status: Married    Spouse name: Not on file   Number of children: Not on file   Years of education: Not on file   Highest education level: Not on file   Occupational History   Not on file  Tobacco Use   Smoking status: Never   Smokeless tobacco: Never  Vaping Use   Vaping status: Never Used  Substance and Sexual Activity   Alcohol use: Yes    Alcohol/week: 20.0 standard drinks of alcohol    Types: 20 Cans of beer per week    Comment: moderate   Drug use: No   Sexual activity: Yes  Other Topics Concern   Not on file  Social History Narrative   Not on file   Social Determinants of Health   Financial Resource Strain: Not on file  Food Insecurity: Not on file  Transportation Needs: Not on file  Physical Activity: Not on file  Stress: Not on file  Social Connections: Not on file  Intimate Partner Violence: Not on file     Constitutional: Denies fever, malaise, fatigue, headache or abrupt weight changes.  HEENT: Denies eye pain, eye redness, ear pain, ringing in the ears, wax buildup, runny nose, nasal congestion, bloody nose, or sore throat. Respiratory: Denies difficulty breathing, shortness of breath, cough or sputum production.   Cardiovascular: Denies chest pain, chest tightness, palpitations or swelling in the hands or  feet.  Gastrointestinal: Denies abdominal pain, bloating, constipation, diarrhea or blood in the stool.  GU: Patient reports erectile dysfunction.  Denies urgency, frequency, pain with urination, burning sensation, blood in urine, odor or discharge. Musculoskeletal: Denies decrease in range of motion, difficulty with gait, muscle pain or joint pain and swelling.  Skin: Denies redness, rashes, lesions or ulcercations.  Neurological: Denies dizziness, difficulty with memory, difficulty with speech or problems with balance and coordination.  Psych: Denies anxiety, depression, SI/HI.  No other specific complaints in a complete review of systems (except as listed in HPI above).  Objective:   Physical Exam   BP 114/82 (BP Location: Left Arm, Patient Position: Sitting, Cuff Size: Large)   Pulse 83   Temp  (!) 96.6 F (35.9 C) (Temporal)   Ht 5\' 10"  (1.778 m)   Wt 254 lb (115.2 kg)   SpO2 99%   BMI 36.45 kg/m   Wt Readings from Last 3 Encounters:  03/24/22 265 lb (120.2 kg)  09/21/21 269 lb (122 kg)  03/05/20 251 lb (113.9 kg)    General: Appears his stated age, obese, in NAD. Skin: Warm, dry and intact.  HEENT: Head: normal shape and size; Eyes: sclera white, no icterus, conjunctiva pink, PERRLA and EOMs intact;  Neck:  Neck supple, trachea midline. No masses, lumps or thyromegaly present.  Cardiovascular: Normal rate and rhythm. S1,S2 noted.  No murmur, rubs or gallops noted. No JVD or BLE edema. No carotid bruits noted. Pulmonary/Chest: Normal effort and positive vesicular breath sounds. No respiratory distress. No wheezes, rales or ronchi noted.  Abdomen: Soft and nontender. Normal bowel sounds.  Musculoskeletal: Strength 5/5 BUE/BLE. No difficulty with gait.  Neurological: Alert and oriented. Cranial nerves II-XII grossly intact. Coordination normal.  Psychiatric: Mood and affect normal. Behavior is normal. Judgment and thought content normal.    BMET    Component Value Date/Time   NA 140 09/23/2021 1440   K 4.3 09/23/2021 1440   CL 104 09/23/2021 1440   CO2 24 09/23/2021 1440   GLUCOSE 105 (H) 09/23/2021 1440   GLUCOSE 131 (H) 11/16/2020 1244   BUN 14 09/23/2021 1440   CREATININE 0.96 09/23/2021 1440   CALCIUM 9.4 09/23/2021 1440   GFRNONAA >60 11/16/2020 1244   GFRAA 118 11/13/2019 1635    Lipid Panel     Component Value Date/Time   CHOL 167 12/27/2021 1453   TRIG 116 12/27/2021 1453   HDL 57 12/27/2021 1453   CHOLHDL 2.9 12/27/2021 1453   LDLCALC 89 12/27/2021 1453    CBC    Component Value Date/Time   WBC 4.7 09/23/2021 1440   WBC 9.8 11/16/2020 1141   RBC 5.29 09/23/2021 1440   RBC 5.08 11/16/2020 1141   HGB 16.9 09/23/2021 1440   HCT 48.1 09/23/2021 1440   PLT 248 09/23/2021 1440   MCV 91 09/23/2021 1440   MCH 31.9 09/23/2021 1440   MCH 32.5  11/16/2020 1141   MCHC 35.1 09/23/2021 1440   MCHC 35.9 11/16/2020 1141   RDW 13.1 09/23/2021 1440    Hgb A1C Lab Results  Component Value Date   HGBA1C 5.3 09/23/2021           Assessment & Plan:   Preventative health maintenance:  Flu shot declined Tetanus today Encouraged him to get his COVID booster Discussed Shingrix vaccine, he will check coverage with his insurance company schedule visit he would like to have this done Referral to GI for screening colonoscopy Encourage him to consume a  balanced diet and exercise regimen Advised him to see an eye doctor and dentist annually We will check CBC, c-Met, lipid, A1c and PSA today  Familial hypertrophic cardiomyopathy:  Indication for ECG: Familial hypertrophic cardiomyopathy Interpretation of ECG: Comparison of ECG: 2010 Will obtain familial cardiomyopathy panel  RTC in 6 months, follow-up chronic conditions Nicki Reaper, NP

## 2023-03-07 NOTE — Patient Instructions (Signed)
Health Maintenance, Male Adopting a healthy lifestyle and getting preventive care are important in promoting health and wellness. Ask your health care provider about: The right schedule for you to have regular tests and exams. Things you can do on your own to prevent diseases and keep yourself healthy. What should I know about diet, weight, and exercise? Eat a healthy diet  Eat a diet that includes plenty of vegetables, fruits, low-fat dairy products, and lean protein. Do not eat a lot of foods that are high in solid fats, added sugars, or sodium. Maintain a healthy weight Body mass index (BMI) is a measurement that can be used to identify possible weight problems. It estimates body fat based on height and weight. Your health care provider can help determine your BMI and help you achieve or maintain a healthy weight. Get regular exercise Get regular exercise. This is one of the most important things you can do for your health. Most adults should: Exercise for at least 150 minutes each week. The exercise should increase your heart rate and make you sweat (moderate-intensity exercise). Do strengthening exercises at least twice a week. This is in addition to the moderate-intensity exercise. Spend less time sitting. Even light physical activity can be beneficial. Watch cholesterol and blood lipids Have your blood tested for lipids and cholesterol at 55 years of age, then have this test every 5 years. You may need to have your cholesterol levels checked more often if: Your lipid or cholesterol levels are high. You are older than 55 years of age. You are at high risk for heart disease. What should I know about cancer screening? Many types of cancers can be detected early and may often be prevented. Depending on your health history and family history, you may need to have cancer screening at various ages. This may include screening for: Colorectal cancer. Prostate cancer. Skin cancer. Lung  cancer. What should I know about heart disease, diabetes, and high blood pressure? Blood pressure and heart disease High blood pressure causes heart disease and increases the risk of stroke. This is more likely to develop in people who have high blood pressure readings or are overweight. Talk with your health care provider about your target blood pressure readings. Have your blood pressure checked: Every 3-5 years if you are 18-39 years of age. Every year if you are 40 years old or older. If you are between the ages of 65 and 75 and are a current or former smoker, ask your health care provider if you should have a one-time screening for abdominal aortic aneurysm (AAA). Diabetes Have regular diabetes screenings. This checks your fasting blood sugar level. Have the screening done: Once every three years after age 45 if you are at a normal weight and have a low risk for diabetes. More often and at a younger age if you are overweight or have a high risk for diabetes. What should I know about preventing infection? Hepatitis B If you have a higher risk for hepatitis B, you should be screened for this virus. Talk with your health care provider to find out if you are at risk for hepatitis B infection. Hepatitis C Blood testing is recommended for: Everyone born from 1945 through 1965. Anyone with known risk factors for hepatitis C. Sexually transmitted infections (STIs) You should be screened each year for STIs, including gonorrhea and chlamydia, if: You are sexually active and are younger than 55 years of age. You are older than 55 years of age and your   health care provider tells you that you are at risk for this type of infection. Your sexual activity has changed since you were last screened, and you are at increased risk for chlamydia or gonorrhea. Ask your health care provider if you are at risk. Ask your health care provider about whether you are at high risk for HIV. Your health care provider  may recommend a prescription medicine to help prevent HIV infection. If you choose to take medicine to prevent HIV, you should first get tested for HIV. You should then be tested every 3 months for as long as you are taking the medicine. Follow these instructions at home: Alcohol use Do not drink alcohol if your health care provider tells you not to drink. If you drink alcohol: Limit how much you have to 0-2 drinks a day. Know how much alcohol is in your drink. In the [place] one drink equals one 12 oz bottle of beer (355 mL), one 5 oz glass of wine (148 mL), or one 1 oz glass of hard liquor (44 mL). Lifestyle Do not use any products that contain nicotine or tobacco. These products include cigarettes, chewing tobacco, and vaping devices, such as e-cigarettes. If you need help quitting, ask your health care provider. Do not use street drugs. Do not share needles. Ask your health care provider for help if you need support or information about quitting drugs. General instructions Schedule regular health, dental, and eye exams. Stay current with your vaccines. Tell your health care provider if: You often feel depressed. You have ever been abused or do not feel safe at home. Summary Adopting a healthy lifestyle and getting preventive care are important in promoting health and wellness. Follow your health care provider's instructions about healthy diet, exercising, and getting tested or screened for diseases. Follow your health care provider's instructions on monitoring your cholesterol and blood pressure. This information is not intended to replace advice given to you by your health care provider. Make sure you discuss any questions you have with your health care provider. Document Revised: 10/12/2020 Document Reviewed: 10/12/2020 Elsevier Patient Education  2024 [employer]  

## 2023-03-07 NOTE — Assessment & Plan Note (Signed)
Encouraged diet and exercise for weight loss ?

## 2023-03-13 ENCOUNTER — Telehealth: Payer: Self-pay

## 2023-03-13 ENCOUNTER — Other Ambulatory Visit: Payer: Self-pay

## 2023-03-13 DIAGNOSIS — Z860100 Personal history of colon polyps, unspecified: Secondary | ICD-10-CM

## 2023-03-13 MED ORDER — NA SULFATE-K SULFATE-MG SULF 17.5-3.13-1.6 GM/177ML PO SOLN
1.0000 | Freq: Once | ORAL | 0 refills | Status: AC
Start: 2023-03-13 — End: 2023-03-13

## 2023-03-13 NOTE — Telephone Encounter (Signed)
Gastroenterology Pre-Procedure Review  Request Date: 04/11/23 Requesting Physician: Dr. Allegra Lai  PATIENT REVIEW QUESTIONS: The patient responded to the following health history questions as indicated:    1. Are you having any GI issues? no 2. Do you have a personal history of Polyps? yes (last colonoscopy was with Dr. Meridee Score 03/05/20 recommended repeat in 3 years) 3. Do you have a family history of Colon Cancer or Polyps? no 4. Diabetes Mellitus? no 5. Joint replacements in the past 12 months?no 6. Major health problems in the past 3 months?no 7. Any artificial heart valves, MVP, or defibrillator?no    MEDICATIONS & ALLERGIES:    Patient reports the following regarding taking any anticoagulation/antiplatelet therapy:   Plavix, Coumadin, Eliquis, Xarelto, Lovenox, Pradaxa, Brilinta, or Effient? no Aspirin? no  Patient confirms/reports the following medications:  Current Outpatient Medications  Medication Sig Dispense Refill   lovastatin (MEVACOR) 10 MG tablet TAKE 1 TABLET(10 MG) BY MOUTH AT BEDTIME 90 tablet 1   meclizine (ANTIVERT) 25 MG tablet Take 1 tablet (25 mg total) by mouth 3 (three) times daily as needed for dizziness. 30 tablet 0   naproxen (NAPROSYN) 500 MG tablet Take 1 tablet (500 mg total) by mouth 2 (two) times daily with a meal. 20 tablet 2   omeprazole (PRILOSEC) 40 MG capsule TAKE 1 CAPSULE(40 MG) BY MOUTH DAILY 90 capsule 1   sildenafil (REVATIO) 20 MG tablet Take 1-5 pills about 30 min prior to sex. Start with 1 and increase as needed. 12 tablet 5   No current facility-administered medications for this visit.    Patient confirms/reports the following allergies:  Allergies  Allergen Reactions   Allegra-D [Fexofenadine-Pseudoephed Er] Swelling    Swelling of the eyes    No orders of the defined types were placed in this encounter.   AUTHORIZATION INFORMATION Primary Insurance: 1D#: Group #:  Secondary Insurance: 1D#: Group #:  SCHEDULE  INFORMATION: Date: 04/11/23 Time: Location: [facility]

## 2023-03-13 NOTE — Telephone Encounter (Signed)
Returned phone call to patient to schedule his colonoscopy.  Left voice message for him to call me back.  Thanks,  [place], [place]

## 2023-03-13 NOTE — Telephone Encounter (Signed)
Patient called back to schedule procedure.

## 2023-03-16 ENCOUNTER — Telehealth: Payer: Self-pay | Admitting: Internal Medicine

## 2023-03-16 NOTE — Telephone Encounter (Signed)
[facility] is calling in because they are sending in a questionnaire via fax that needs to be filled out, signed, and sent back to fax number: [phone].

## 2023-03-17 NOTE — Telephone Encounter (Signed)
We never received this  fax if  they  call back have them to  refax paperwork  please

## 2023-03-22 NOTE — Telephone Encounter (Signed)
[facility] is calling to check on the status of the paperwork they sent over on 03/17/23. [facility] says if the form wasn't received to please call them and let them know. [phone]

## 2023-03-22 NOTE — Telephone Encounter (Signed)
Spoke with [facility] and fax requested to be resent.

## 2023-03-28 LAB — COMPREHENSIVE METABOLIC PANEL
ALT: 38 [IU]/L (ref 0–44)
AST: 38 [IU]/L (ref 0–40)
Albumin: 4.2 g/dL (ref 3.8–4.9)
Alkaline Phosphatase: 91 [IU]/L (ref 44–121)
BUN/Creatinine Ratio: 21 — ABNORMAL HIGH (ref 9–20)
BUN: 16 mg/dL (ref 6–24)
Bilirubin Total: 0.3 mg/dL (ref 0.0–1.2)
CO2: 22 mmol/L (ref 20–29)
Calcium: 9.2 mg/dL (ref 8.7–10.2)
Chloride: 105 mmol/L (ref 96–106)
Creatinine, Ser: 0.75 mg/dL — ABNORMAL LOW (ref 0.76–1.27)
Globulin, Total: 2.5 g/dL (ref 1.5–4.5)
Glucose: 109 mg/dL — ABNORMAL HIGH (ref 70–99)
Potassium: 4.3 mmol/L (ref 3.5–5.2)
Sodium: 142 mmol/L (ref 134–144)
Total Protein: 6.7 g/dL (ref 6.0–8.5)
eGFR: 107 mL/min/{1.73_m2} (ref >59)

## 2023-03-28 LAB — PSA: Prostate Specific Ag, Serum: 2.3 ng/mL (ref 0.0–4.0)

## 2023-03-28 LAB — FAMILIAL CARDIOMYOPATHY PANEL

## 2023-03-28 LAB — HEMOGLOBIN A1C
Est. average glucose Bld gHb Est-mCnc: 111 mg/dL
Hgb A1c MFr Bld: 5.5 % (ref 4.8–5.6)

## 2023-03-28 LAB — LIPID PANEL
Chol/HDL Ratio: 3.8 {ratio} (ref 0.0–5.0)
Cholesterol, Total: 156 mg/dL (ref 100–199)
HDL: 41 mg/dL (ref >39)
LDL Chol Calc (NIH): 96 mg/dL (ref 0–99)
Triglycerides: 101 mg/dL (ref 0–149)
VLDL Cholesterol Cal: 19 mg/dL (ref 5–40)

## 2023-03-28 LAB — CBC
Hematocrit: 46.5 % (ref 37.5–51.0)
Hemoglobin: 15.7 g/dL (ref 13.0–17.7)
MCH: 31.7 pg (ref 26.6–33.0)
MCHC: 33.8 g/dL (ref 31.5–35.7)
MCV: 94 fL (ref 79–97)
Platelets: 260 10*3/uL (ref 150–450)
RBC: 4.95 x10E6/uL (ref 4.14–5.80)
RDW: 12.9 % (ref 11.6–15.4)
WBC: 7.1 10*3/uL (ref 3.4–10.8)

## 2023-04-11 ENCOUNTER — Ambulatory Visit: Payer: Managed Care, Other (non HMO) | Admitting: Anesthesiology

## 2023-04-11 ENCOUNTER — Ambulatory Visit
Admission: RE | Admit: 2023-04-11 | Discharge: 2023-04-11 | Disposition: A | Discharge status: Home / Self Care | Payer: Managed Care, Other (non HMO) | Attending: Gastroenterology | Admitting: Gastroenterology

## 2023-04-11 ENCOUNTER — Encounter
Admission: RE | Disposition: A | Discharge status: Home / Self Care | Payer: Self-pay | Source: Home / Self Care | Attending: Gastroenterology

## 2023-04-11 DIAGNOSIS — K635 Polyp of colon: Secondary | ICD-10-CM

## 2023-04-11 DIAGNOSIS — G4730 Sleep apnea, unspecified: Secondary | ICD-10-CM | POA: Insufficient documentation

## 2023-04-11 DIAGNOSIS — D123 Benign neoplasm of transverse colon: Secondary | ICD-10-CM | POA: Insufficient documentation

## 2023-04-11 DIAGNOSIS — Z860100 Personal history of colon polyps, unspecified: Secondary | ICD-10-CM

## 2023-04-11 DIAGNOSIS — Z1211 Encounter for screening for malignant neoplasm of colon: Secondary | ICD-10-CM | POA: Diagnosis not present

## 2023-04-11 DIAGNOSIS — K219 Gastro-esophageal reflux disease without esophagitis: Secondary | ICD-10-CM | POA: Diagnosis not present

## 2023-04-11 HISTORY — PX: COLONOSCOPY WITH PROPOFOL: SHX5780

## 2023-04-11 HISTORY — PX: POLYPECTOMY: SHX5525

## 2023-04-11 SURGERY — COLONOSCOPY WITH PROPOFOL
Anesthesia: General

## 2023-04-11 MED ORDER — PROPOFOL 10 MG/ML IV BOLUS
INTRAVENOUS | Status: DC | PRN
  Administered 2023-04-11 (×2): 20 mg via INTRAVENOUS
  Administered 2023-04-11: 10 mg via INTRAVENOUS
  Administered 2023-04-11: 100 mg via INTRAVENOUS

## 2023-04-11 MED ORDER — PROPOFOL 500 MG/50ML IV EMUL
INTRAVENOUS | Status: DC | PRN
  Administered 2023-04-11: 17 ug/kg/min via INTRAVENOUS

## 2023-04-11 MED ORDER — DEXMEDETOMIDINE HCL IN NACL 80 MCG/20ML IV SOLN
INTRAVENOUS | Status: DC | PRN
  Administered 2023-04-11: 8 ug via INTRAVENOUS
  Administered 2023-04-11: 4 ug via INTRAVENOUS

## 2023-04-11 MED ORDER — SODIUM CHLORIDE 0.9 % IV SOLN: INTRAVENOUS | Status: DC

## 2023-04-11 MED ORDER — LIDOCAINE HCL (CARDIAC) PF 100 MG/5ML IV SOSY
PREFILLED_SYRINGE | INTRAVENOUS | Status: DC | PRN
  Administered 2023-04-11: 40 mg via INTRAVENOUS

## 2023-04-11 NOTE — Anesthesia Preprocedure Evaluation (Addendum)
Anesthesia Evaluation  Patient identified by MRN, date of birth, ID band Patient awake    Reviewed: Allergy & Precautions, H&P , NPO status , Patient's Chart, lab work & pertinent test results  Airway Mallampati: II  TM Distance: >3 FB Neck ROM: full    Dental no notable dental hx.    Pulmonary sleep apnea    Pulmonary exam normal        Cardiovascular negative cardio ROS Normal cardiovascular exam     Neuro/Psych negative neurological ROS  negative psych ROS   GI/Hepatic Neg liver ROS,GERD  ,,  Endo/Other  negative endocrine ROS    Renal/GU negative Renal ROS  negative genitourinary   Musculoskeletal   Abdominal  (+) + obese  Peds  Hematology negative hematology ROS (+)   Anesthesia Other Findings Past Medical History: No date: Chicken pox No date: GERD (gastroesophageal reflux disease) No date: Sleep apnea     Comment:  CPAP use  Past Surgical History: No date: FOOT SURGERY 1985: TONSILLECTOMY AND ADENOIDECTOMY     Reproductive/Obstetrics negative OB ROS                             Anesthesia Physical Anesthesia Plan  ASA: 2  Anesthesia Plan: General   Post-op Pain Management:    Induction: Intravenous  PONV Risk Score and Plan: Propofol infusion and TIVA  Airway Management Planned: Natural Airway  Additional Equipment:   Intra-op Plan:   Post-operative Plan:   Informed Consent: I have reviewed the patients History and Physical, chart, labs and discussed the procedure including the risks, benefits and alternatives for the proposed anesthesia with the patient or authorized representative who has indicated his/her understanding and acceptance.     Dental Advisory Given  Plan Discussed with: CRNA and Surgeon  Anesthesia Plan Comments:         Anesthesia Quick Evaluation

## 2023-04-11 NOTE — Transfer of Care (Signed)
Immediate Anesthesia Transfer of Care Note  Patient: Luis Perez  Procedure(s) Performed: Procedure(s): COLONOSCOPY WITH PROPOFOL (N/A) POLYPECTOMY  Patient Location: PACU and Endoscopy Unit  Anesthesia Type:General  Level of Consciousness: sedated  Airway & Oxygen Therapy: Patient Spontanous Breathing and Patient connected to nasal cannula oxygen  Post-op Assessment: Report given to RN and Post -op Vital signs reviewed and stable  Post vital signs: Reviewed and stable  Last Vitals:  Vitals:   04/11/23 1052 04/11/23 1157  BP: (!) 138/98 107/76  Pulse: 66 71  Resp: 18 12  Temp: 36.5 C (!) 36.4 C  SpO2: 100% 94%    Complications: No apparent anesthesia complications

## 2023-04-11 NOTE — Anesthesia Procedure Notes (Signed)
Date/Time: 04/11/2023 11:40 AM  Performed by: Stormy Fabian, CRNAPre-anesthesia Checklist: Patient identified, Emergency Drugs available, Suction available and Patient being monitored Patient Re-evaluated:Patient Re-evaluated prior to induction Oxygen Delivery Method: Supernova nasal CPAP Induction Type: IV induction Dental Injury: Teeth and Oropharynx as per pre-operative assessment  Comments: Nasal cannula with etCO2 monitoring

## 2023-04-11 NOTE — Anesthesia Postprocedure Evaluation (Signed)
Anesthesia Post Note  Patient: Luis Perez  Procedure(s) Performed: COLONOSCOPY WITH PROPOFOL POLYPECTOMY  Patient location during evaluation: Endoscopy Anesthesia Type: General Level of consciousness: awake and alert Pain management: pain level controlled Vital Signs Assessment: post-procedure vital signs reviewed and stable Respiratory status: spontaneous breathing, nonlabored ventilation and respiratory function stable Cardiovascular status: blood pressure returned to baseline and stable Postop Assessment: no apparent nausea or vomiting Anesthetic complications: no   No notable events documented.   Last Vitals:  Vitals:   04/11/23 1207 04/11/23 1217  BP: 111/68 115/82  Pulse: 73 64  Resp: 20 13  Temp:    SpO2: 97% 96%    Last Pain:  Vitals:   04/11/23 1217  TempSrc:   PainSc: 0-No pain                 Foye Deer

## 2023-04-11 NOTE — Op Note (Signed)
[facility] Gastroenterology Patient Name: Luis Perez Procedure Date: 04/11/2023 11:25 AM MRN: [id number] Account #: [id number] Date of Birth: Oct 12, 1965 Admit Type: Outpatient Age: 57 Room: [facility] ENDO ROOM 3 Gender: Male Note Status: Finalized Instrument Name: Prentice Docker [id number] Procedure:             Colonoscopy Indications:           Surveillance: Personal history of adenomatous polyps                         on last colonoscopy 3 years ago, Last colonoscopy:                         September 2021 Providers:             Toney Reil MD, MD Referring MD:          Toney Reil MD, MD (Referring MD), Lorre Munroe (Referring MD) Medicines:             General Anesthesia Complications:         No immediate complications. Estimated blood loss: None. Procedure:             Pre-Anesthesia Assessment:                        - Prior to the procedure, a History and Physical was                         performed, and patient medications and allergies were                         reviewed. The patient is competent. The risks and                         benefits of the procedure and the sedation options and                         risks were discussed with the patient. All questions                         were answered and informed consent was obtained.                         Patient identification and proposed procedure were                         verified by the physician, the nurse, the                         anesthesiologist, the anesthetist and the technician                         in the pre-procedure area in the procedure room in the                         endoscopy suite. Mental Status Examination: alert and  oriented. Airway Examination: normal oropharyngeal                         airway and neck mobility. Respiratory Examination:                         clear to auscultation. CV Examination:  normal.                         Prophylactic Antibiotics: The patient does not require                         prophylactic antibiotics. Prior Anticoagulants: The                         patient has taken no anticoagulant or antiplatelet                         agents. ASA Grade Assessment: II - A patient with mild                         systemic disease. After reviewing the risks and                         benefits, the patient was deemed in satisfactory                         condition to undergo the procedure. The anesthesia                         plan was to use general anesthesia. Immediately prior                         to administration of medications, the patient was                         re-assessed for adequacy to receive sedatives. The                         heart rate, respiratory rate, oxygen saturations,                         blood pressure, adequacy of pulmonary ventilation, and                         response to care were monitored throughout the                         procedure. The physical status of the patient was                         re-assessed after the procedure.                        After obtaining informed consent, the colonoscope was                         passed under direct vision. Throughout the procedure,  the patient's blood pressure, pulse, and oxygen                         saturations were monitored continuously. The                         Colonoscope was introduced through the anus and                         advanced to the the cecum, identified by appendiceal                         orifice and ileocecal valve. The colonoscopy was                         performed without difficulty. The patient tolerated                         the procedure well. The quality of the bowel                         preparation was evaluated using the BBPS [facility] Bowel                         Preparation Scale) with scores of:  Right Colon = 3,                         Transverse Colon = 3 and Left Colon = 3 (entire mucosa                         seen well with no residual staining, small fragments                         of stool or opaque liquid). The total BBPS score                         equals 9. The ileocecal valve, appendiceal orifice,                         and rectum were photographed. Findings:      The perianal and digital rectal examinations were normal. Pertinent       negatives include normal sphincter tone and no palpable rectal lesions.      A 4 mm polyp was found in the transverse colon. The polyp was sessile.       The polyp was removed with a jumbo cold forceps. Resection and retrieval       were complete. Estimated blood loss: none.      The retroflexed view of the distal rectum and anal verge was normal and       showed no anal or rectal abnormalities. Impression:            - One 4 mm polyp in the transverse colon, removed with                         a jumbo cold forceps. Resected and retrieved.                        -  The distal rectum and anal verge are normal on                         retroflexion view. Recommendation:        - Discharge patient to home (with escort).                        - Resume previous diet today.                        - Continue present medications.                        - Await pathology results.                        - Repeat colonoscopy in 5 years for surveillance. Procedure Code(s):     --- Professional ---                        [phone], Colonoscopy, flexible; with biopsy, single or                         multiple Diagnosis Code(s):     --- Professional ---                        Z86.010, Personal history of colonic polyps                        D12.3, Benign neoplasm of transverse colon (hepatic                         flexure or splenic flexure) CPT copyright 2022 American Medical Association. All rights reserved. The codes documented in this  report are preliminary and upon coder review may  be revised to meet current compliance requirements. Dr. Libby Maw Toney Reil MD, MD 04/11/2023 11:53:07 AM This report has been signed electronically. Number of Addenda: 0 Note Initiated On: 04/11/2023 11:25 AM Scope Withdrawal Time: 0 hours 7 minutes 42 seconds  Total Procedure Duration: 0 hours 12 minutes 18 seconds  Estimated Blood Loss:  Estimated blood loss: none.      [facility]

## 2023-04-11 NOTE — H&P (Signed)
Arlyss Repress, MD [address]  [address]  [place], [place] [address]  Main: [phone]  Fax: [phone] Pager: [phone]  Primary Care Physician:  Lorre Munroe, NP Primary Gastroenterologist:  Dr. Arlyss Repress  Pre-Procedure History & Physical: HPI:  Luis Perez is a 57 y.o. male is here for an colonoscopy.   Past Medical History:  Diagnosis Date   Chicken pox    GERD (gastroesophageal reflux disease)    Sleep apnea    CPAP use    Past Surgical History:  Procedure Laterality Date   FOOT SURGERY     TONSILLECTOMY AND ADENOIDECTOMY  1985    Prior to Admission medications   Medication Sig Start Date End Date Taking? Authorizing Provider  lovastatin (MEVACOR) 10 MG tablet TAKE 1 TABLET(10 MG) BY MOUTH AT BEDTIME 09/19/22   Lorre Munroe, NP  meclizine (ANTIVERT) 25 MG tablet Take 1 tablet (25 mg total) by mouth 3 (three) times daily as needed for dizziness. 11/14/19   Lorre Munroe, NP  naproxen (NAPROSYN) 500 MG tablet Take 1 tablet (500 mg total) by mouth 2 (two) times daily with a meal. 11/16/20   Jene Every, MD  omeprazole (PRILOSEC) 40 MG capsule TAKE 1 CAPSULE(40 MG) BY MOUTH DAILY 09/19/22   Lorre Munroe, NP  sildenafil (REVATIO) 20 MG tablet Take 1-5 pills about 30 min prior to sex. Start with 1 and increase as needed. 03/24/22   Lorre Munroe, NP    Allergies as of 03/13/2023 - Review Complete 03/13/2023  Allergen Reaction Noted   Allegra-d [fexofenadine-pseudoephed er] Swelling 12/31/2014    Family History  Problem Relation Age of Onset   Arthritis Mother    Heart disease Brother        HCM   Arthritis Maternal Grandfather    Cancer Neg Hx    Diabetes Neg Hx    Stroke Neg Hx    Esophageal cancer Neg Hx    Rectal cancer Neg Hx    Stomach cancer Neg Hx    Colon cancer Neg Hx    Colon polyps Neg Hx     Social History   Socioeconomic History   Marital status: Married    Spouse name: Not on file   Number of children: Not  on file   Years of education: Not on file   Highest education level: Not on file  Occupational History   Not on file  Tobacco Use   Smoking status: Never   Smokeless tobacco: Never  Vaping Use   Vaping status: Never Used  Substance and Sexual Activity   Alcohol use: Yes    Alcohol/week: 20.0 standard drinks of alcohol    Types: 20 Cans of beer per week    Comment: moderate   Drug use: No   Sexual activity: Yes  Other Topics Concern   Not on file  Social History Narrative   Not on file   Social Determinants of Health   Financial Resource Strain: Not on file  Food Insecurity: Not on file  Transportation Needs: Not on file  Physical Activity: Not on file  Stress: Not on file  Social Connections: Not on file  Intimate Partner Violence: Not on file    Review of Systems: See HPI, otherwise negative ROS  Physical Exam: BP (!) 138/98   Pulse 66   Temp 97.7 F (36.5 C) (Temporal)   Resp 18   Ht 5\' 10"  (1.778 m)   SpO2 100%  BMI 36.45 kg/m  General:   Alert,  pleasant and cooperative in NAD Head:  Normocephalic and atraumatic. Neck:  Supple; no masses or thyromegaly. Lungs:  Clear throughout to auscultation.    Heart:  Regular rate and rhythm. Abdomen:  Soft, nontender and nondistended. Normal bowel sounds, without guarding, and without rebound.   Neurologic:  Alert and  oriented x4;  grossly normal neurologically.  Impression/Plan: Luis Perez is here for an colonoscopy to be performed for h/o colon adenomas  Risks, benefits, limitations, and alternatives regarding  colonoscopy have been reviewed with the patient.  Questions have been answered.  All parties agreeable.   Lannette Donath, MD  04/11/2023, 11:33 AM

## 2023-04-12 ENCOUNTER — Encounter: Payer: Self-pay | Admitting: Gastroenterology

## 2023-04-12 LAB — SURGICAL PATHOLOGY

## 2023-04-14 ENCOUNTER — Encounter: Payer: Self-pay | Admitting: Gastroenterology

## 2023-09-05 ENCOUNTER — Ambulatory Visit: Payer: Managed Care, Other (non HMO) | Admitting: Internal Medicine

## 2023-09-05 ENCOUNTER — Encounter: Payer: Self-pay | Admitting: Internal Medicine

## 2023-09-05 VITALS — BP 118/80 | Ht 70.0 in | Wt 249.2 lb

## 2023-09-05 DIAGNOSIS — K219 Gastro-esophageal reflux disease without esophagitis: Secondary | ICD-10-CM

## 2023-09-05 DIAGNOSIS — R739 Hyperglycemia, unspecified: Secondary | ICD-10-CM

## 2023-09-05 DIAGNOSIS — N200 Calculus of kidney: Secondary | ICD-10-CM

## 2023-09-05 DIAGNOSIS — E782 Mixed hyperlipidemia: Secondary | ICD-10-CM | POA: Diagnosis not present

## 2023-09-05 DIAGNOSIS — Z6835 Body mass index (BMI) 35.0-35.9, adult: Secondary | ICD-10-CM

## 2023-09-05 DIAGNOSIS — E66812 Obesity, class 2: Secondary | ICD-10-CM | POA: Diagnosis not present

## 2023-09-05 DIAGNOSIS — N528 Other male erectile dysfunction: Secondary | ICD-10-CM

## 2023-09-05 MED ORDER — TAMSULOSIN HCL 0.4 MG PO CAPS: 0.4000 mg | ORAL_CAPSULE | Freq: Every day | ORAL | 2 refills | Status: DC

## 2023-09-05 NOTE — Patient Instructions (Signed)
 GERD in Adults: Diet Changes When you have gastroesophageal reflux disease (GERD), you may need to make changes to your diet. Choosing the right foods can help with your symptoms. Think about working with an expert in healthy eating called a dietitian. They can help you make healthy food choices. What are tips for following this plan? Reading food labels Look for foods that are low in saturated fat. Foods that may help with your symptoms include: Foods with less than 5% of daily value (DV) of fat. Foods with 0 grams of trans fat. Cooking [employer] in ways that don't use a lot of fat. These ways include: Baking. Steaming. Grilling. Broiling. To add flavor, try to use herbs that are low in spice and acidity. Avoid frying your food. Meal planning  Eat small meals often rather than eating 3 large meals each day. Eat your meals slowly in a place where you feel relaxed. If told by your health care provider, avoid: Foods that cause symptoms. Keep a food diary to keep track of foods that cause symptoms. Alcohol. Drinking a lot of liquid with meals. General instructions For 2-3 hours after you eat, avoid: Bending over. Exercise. Lying down. Chew sugar-free gum after meals. What foods should I eat? Eat a healthy diet. Try to include: Foods with high amounts of fiber. These include: Fruits and vegetables. Whole grains and beans. Low-fat dairy products. Lean meats, fish, and poultry. Egg whites. Foods that cause symptoms in someone else may not cause symptoms for you. Work with your provider to find foods that are safe for you. The items listed above may not be all the foods and drinks you can have. Talk with a dietitian to learn more. The items listed above may not be a complete list of foods and beverages you can eat and drink. Contact a dietitian for more information. What foods should I avoid? Limiting some of these foods may help with your symptoms. Each person is different.  Talk with a dietitian or your provider to help you find the exact foods to avoid. Some of the foods to avoid may include: Fruits Fruits with a lot of acid in them. These may include citrus fruits, such as oranges, grapefruit, pineapple, and lemons. Vegetables Deep-fried vegetables, such as [place] fries. Vegetables, sauces, or toppings made with added fat and vegetables with acid in them. These may include tomatoes and tomato products, chili peppers, onions, garlic, and horseradish. Grains Pastries or quick breads with added fat. Meats and other proteins High-fat meats, such as fatty beef or pork, hot dogs, ribs, ham, sausage, salami, and bacon. Fried meat or protein, such as fried fish and fried chicken. Egg yolks. Fats and oils Butter. Margarine. Shortening. Ghee. Drinks Coffee and other drinks with caffeine in them. Fizzy and sugary drinks, such as soda and energy drinks. Fruit juice made with acidic fruits, such as orange or grapefruit. Tomato juice. Sweets and desserts Chocolate and cocoa. Donuts. Seasonings and condiments Mint, such as peppermint and spearmint. Condiments, herbs, or seasonings that cause symptoms. These may include curry, hot sauce, or vinegar-based salad dressings. The items listed above may not be all the foods and drinks you should avoid. Talk with a dietitian to learn more. Questions to ask your health care provider Changes to your diet and everyday life are often the first steps taken to manage symptoms of GERD. If these changes don't help, talk with your provider about taking medicines. Where to find more information International Foundation for Gastrointestinal Disorders:  aboutgerd.org This information is not intended to replace advice given to you by your health care provider. Make sure you discuss any questions you have with your health care provider. Document Revised: 04/04/2023 Document Reviewed: 10/19/2022 Elsevier Patient Education  2024 [employer].

## 2023-09-05 NOTE — Assessment & Plan Note (Signed)
Avoid foods that trigger your reflux Encouraged weight loss as this can help reduce reflux symptoms Continue omeprazole as needed

## 2023-09-05 NOTE — Progress Notes (Signed)
 Subjective:    Patient ID: Luis Perez, male    DOB: 1966-04-06, 58 y.o.   MRN: [id number]  HPI  Patient presents to the clinic today for follow-up of chronic conditions.  GERD: Triggered by tomato based foods.  He takes omeprazole only as needed.  There is no upper GI on file.  HLD: His last LDL was 96, triglycerides 782, 03/2023.  He is not taking lovastatin as prescribed.  He tries to consume low-fat diet.  ED: He is not currently taking any medications for this but has been prescribed sildenafil in the past he does not follow with urology.  He reports he currently has a kidney stone. He is having RLQ pain. He denies urgency, frequency, dysuria or blood in his urine. He is not currently taking any medication for this but he has taken flomax in the past. He would like this refilled today. He does not follow with urology.  Review of Systems     Past Medical History:  Diagnosis Date   Chicken pox    GERD (gastroesophageal reflux disease)    Sleep apnea    CPAP use    Current Outpatient Medications  Medication Sig Dispense Refill   lovastatin (MEVACOR) 10 MG tablet TAKE 1 TABLET(10 MG) BY MOUTH AT BEDTIME 90 tablet 1   meclizine (ANTIVERT) 25 MG tablet Take 1 tablet (25 mg total) by mouth 3 (three) times daily as needed for dizziness. 30 tablet 0   naproxen (NAPROSYN) 500 MG tablet Take 1 tablet (500 mg total) by mouth 2 (two) times daily with a meal. 20 tablet 2   omeprazole (PRILOSEC) 40 MG capsule TAKE 1 CAPSULE(40 MG) BY MOUTH DAILY 90 capsule 1   sildenafil (REVATIO) 20 MG tablet Take 1-5 pills about 30 min prior to sex. Start with 1 and increase as needed. 12 tablet 5   No current facility-administered medications for this visit.    Allergies  Allergen Reactions   Allegra-D [Fexofenadine-Pseudoephed Er] Swelling    Swelling of the eyes    Family History  Problem Relation Age of Onset   Arthritis Mother    Heart disease Brother        HCM   Arthritis  Maternal Grandfather    Cancer Neg Hx    Diabetes Neg Hx    Stroke Neg Hx    Esophageal cancer Neg Hx    Rectal cancer Neg Hx    Stomach cancer Neg Hx    Colon cancer Neg Hx    Colon polyps Neg Hx     Social History   Socioeconomic History   Marital status: Married    Spouse name: Not on file   Number of children: Not on file   Years of education: Not on file   Highest education level: Associate degree: occupational, Scientist, product/process development, or vocational program  Occupational History   Not on file  Tobacco Use   Smoking status: Never   Smokeless tobacco: Never  Vaping Use   Vaping status: Never Used  Substance and Sexual Activity   Alcohol use: Yes    Alcohol/week: 20.0 standard drinks of alcohol    Types: 20 Cans of beer per week    Comment: moderate   Drug use: No   Sexual activity: Yes  Other Topics Concern   Not on file  Social History Narrative   Not on file   Social Drivers of Health   Financial Resource Strain: Low Risk  (09/04/2023)   Overall Physicist, medical Strain (  CARDIA)    Difficulty of Paying Living Expenses: Not hard at all  Food Insecurity: No Food Insecurity (09/04/2023)   Hunger Vital Sign    Worried About Running Out of Food in the Last Year: Never true    Ran Out of Food in the Last Year: Never true  Transportation Needs: No Transportation Needs (09/04/2023)   PRAPARE - Administrator, Civil Service (Medical): No    Lack of Transportation (Non-Medical): No  Physical Activity: Insufficiently Active (09/04/2023)   Exercise Vital Sign    Days of Exercise per Week: 2 days    Minutes of Exercise per Session: 20 min  Stress: No Stress Concern Present (09/04/2023)   [employer] of Occupational Health - Occupational Stress Questionnaire    Feeling of Stress : Not at all  Social Connections: Moderately Integrated (09/04/2023)   Social Connection and Isolation Panel [NHANES]    Frequency of Communication with Friends and Family: Twice a week     Frequency of Social Gatherings with Friends and Family: Once a week    Attends Religious Services: More than 4 times per year    Active Member of [employer] or Organizations: No    Attends Engineer, structural: Not on file    Marital Status: Married  Catering manager Violence: Not on file     Constitutional: Denies fever, malaise, fatigue, headache or abrupt weight changes.  HEENT: Denies eye pain, eye redness, ear pain, ringing in the ears, wax buildup, runny nose, nasal congestion, bloody nose, or sore throat. Respiratory: Denies difficulty breathing, shortness of breath, cough or sputum production.   Cardiovascular: Denies chest pain, chest tightness, palpitations or swelling in the hands or feet.  Gastrointestinal: Pt reports RLQ pain. Denies bloating, constipation, diarrhea or blood in the stool.  GU: Pt reports erectile dysfunction. Denies urgency, frequency, pain with urination, burning sensation, blood in urine, odor or discharge. Musculoskeletal: Denies decrease in range of motion, difficulty with gait, muscle pain or joint pain and swelling.  Skin: Denies redness, rashes, lesions or ulcercations.  Neurological: Denies dizziness, difficulty with memory, difficulty with speech or problems with balance and coordination.  Psych: Denies anxiety, depression, SI/HI.  No other specific complaints in a complete review of systems (except as listed in HPI above).  Objective:   Physical Exam  BP 118/80 (BP Location: Left Arm, Patient Position: Sitting, Cuff Size: Normal)   Ht 5\' 10"  (1.778 m)   Wt 249 lb 3.2 oz (113 kg)   BMI 35.76 kg/m    Wt Readings from Last 3 Encounters:  04/11/23 248 lb (112.5 kg)  03/07/23 254 lb (115.2 kg)  03/24/22 265 lb (120.2 kg)    General: Appears her stated age, obese, in NAD. Cardiovascular: Normal rate and rhythm. S1,S2 noted.  No murmur, rubs or gallops noted. No JVD or BLE edema. No carotid bruits noted. Pulmonary/Chest: Normal effort and  positive vesicular breath sounds. No respiratory distress. No wheezes, rales or ronchi noted.  Abdomen: Normal bowel sounds.  Musculoskeletal: No difficulty with gait.  Neurological: Alert and oriented. Cranial nerves II-XII grossly intact. Coordination normal.    BMET    Component Value Date/Time   NA 142 03/13/2023 0816   K 4.3 03/13/2023 0816   CL 105 03/13/2023 0816   CO2 22 03/13/2023 0816   GLUCOSE 109 (H) 03/13/2023 0816   GLUCOSE 131 (H) 11/16/2020 1244   BUN 16 03/13/2023 0816   CREATININE 0.75 (L) 03/13/2023 0816   CALCIUM 9.2 03/13/2023  1610   GFRNONAA >60 11/16/2020 1244   GFRAA 118 11/13/2019 1635    Lipid Panel     Component Value Date/Time   CHOL 156 03/13/2023 0816   TRIG 101 03/13/2023 0816   HDL 41 03/13/2023 0816   CHOLHDL 3.8 03/13/2023 0816   LDLCALC 96 03/13/2023 0816    CBC    Component Value Date/Time   WBC 7.1 03/13/2023 0816   WBC 9.8 11/16/2020 1141   RBC 4.95 03/13/2023 0816   RBC 5.08 11/16/2020 1141   HGB 15.7 03/13/2023 0816   HCT 46.5 03/13/2023 0816   PLT 260 03/13/2023 0816   MCV 94 03/13/2023 0816   MCH 31.7 03/13/2023 0816   MCH 32.5 11/16/2020 1141   MCHC 33.8 03/13/2023 0816   MCHC 35.9 11/16/2020 1141   RDW 12.9 03/13/2023 0816    Hgb A1C Lab Results  Component Value Date   HGBA1C 5.5 03/13/2023           Assessment & Plan:   Kidney stone:  He has had these in the past and reports this feels exactly the same Encouraged him to increase his water intake Rx for Flomax 0.4 mg daily.  RTC in 6 months for your annual exam Nicki Reaper, NP

## 2023-09-05 NOTE — Assessment & Plan Note (Signed)
 Sildenafil not effective, will discontinue

## 2023-09-05 NOTE — Assessment & Plan Note (Signed)
 Encouraged diet and exercise for weight loss ?

## 2023-09-05 NOTE — Assessment & Plan Note (Signed)
 Encouraged him to consume a low fat diet Consider restarting lovastatin if LDL greater than 100

## 2023-09-06 ENCOUNTER — Encounter: Payer: Self-pay | Admitting: Internal Medicine

## 2023-09-06 LAB — LIPID PANEL
Chol/HDL Ratio: 3.5 ratio (ref 0.0–5.0)
Cholesterol, Total: 204 mg/dL — ABNORMAL HIGH (ref 100–199)
HDL: 58 mg/dL (ref >39)
LDL Chol Calc (NIH): 131 mg/dL — ABNORMAL HIGH (ref 0–99)
Triglycerides: 85 mg/dL (ref 0–149)
VLDL Cholesterol Cal: 15 mg/dL (ref 5–40)

## 2023-09-06 LAB — CBC
Hematocrit: 47.1 % (ref 37.5–51.0)
Hemoglobin: 16.1 g/dL (ref 13.0–17.7)
MCH: 31.6 pg (ref 26.6–33.0)
MCHC: 34.2 g/dL (ref 31.5–35.7)
MCV: 92 fL (ref 79–97)
Platelets: 227 10*3/uL (ref 150–450)
RBC: 5.1 x10E6/uL (ref 4.14–5.80)
RDW: 12.7 % (ref 11.6–15.4)
WBC: 4.3 10*3/uL (ref 3.4–10.8)

## 2023-09-06 LAB — COMPREHENSIVE METABOLIC PANEL WITH GFR
ALT: 23 IU/L (ref 0–44)
AST: 18 IU/L (ref 0–40)
Albumin: 4.4 g/dL (ref 3.8–4.9)
Alkaline Phosphatase: 85 IU/L (ref 44–121)
BUN/Creatinine Ratio: 13 (ref 9–20)
BUN: 11 mg/dL (ref 6–24)
Bilirubin Total: 0.5 mg/dL (ref 0.0–1.2)
CO2: 22 mmol/L (ref 20–29)
Calcium: 9.4 mg/dL (ref 8.7–10.2)
Chloride: 104 mmol/L (ref 96–106)
Creatinine, Ser: 0.83 mg/dL (ref 0.76–1.27)
Globulin, Total: 2.9 g/dL (ref 1.5–4.5)
Glucose: 94 mg/dL (ref 70–99)
Potassium: 4.1 mmol/L (ref 3.5–5.2)
Sodium: 138 mmol/L (ref 134–144)
Total Protein: 7.3 g/dL (ref 6.0–8.5)
eGFR: 103 mL/min/{1.73_m2} (ref >59)

## 2023-09-06 LAB — HEMOGLOBIN A1C
Est. average glucose Bld gHb Est-mCnc: 103 mg/dL
Hgb A1c MFr Bld: 5.2 % (ref 4.8–5.6)

## 2023-09-06 MED ORDER — LOVASTATIN 10 MG PO TABS: 10.0000 mg | ORAL_TABLET | Freq: Every day | ORAL | 1 refills | Status: DC

## 2023-09-06 NOTE — Addendum Note (Signed)
 Addended by: Lorre Munroe on: 09/06/2023 08:26 AM   Modules accepted: Orders

## 2023-12-02 ENCOUNTER — Other Ambulatory Visit: Payer: Self-pay | Admitting: Internal Medicine

## 2023-12-04 NOTE — Telephone Encounter (Signed)
 Requested Prescriptions  Pending Prescriptions Disp Refills   tamsulosin  (FLOMAX ) 0.4 MG CAPS capsule [Pharmacy Med Name: TAMSULOSIN  0.4MG  CAPSULES] 90 capsule 1    Sig: TAKE 1 CAPSULE(0.4 MG) BY MOUTH DAILY     Urology: Alpha-Adrenergic Blocker Passed - 12/04/2023  3:21 PM      Passed - PSA in normal range and within 360 days    Prostate Specific Ag, Serum  Date Value Ref Range Status  03/13/2023 2.3 0.0 - 4.0 ng/mL Final    Comment:    Roche ECLIA methodology. According to the American Urological Association, Serum PSA should decrease and remain at undetectable levels after radical prostatectomy. The AUA defines biochemical recurrence as an initial PSA value 0.2 ng/mL or greater followed by a subsequent confirmatory PSA value 0.2 ng/mL or greater. Values obtained with different assay methods or kits cannot be used interchangeably. Results cannot be interpreted as absolute evidence of the presence or absence of malignant disease.          Passed - Last BP in normal range    BP Readings from Last 1 Encounters:  09/05/23 118/80         Passed - Valid encounter within last 12 months    Recent Outpatient Visits           3 months ago Mixed hyperlipidemia   [facility] [place], [place], [place]

## 2023-12-23 ENCOUNTER — Encounter: Payer: Self-pay | Admitting: Internal Medicine

## 2023-12-25 MED ORDER — OMEPRAZOLE 40 MG PO CPDR: DELAYED_RELEASE_CAPSULE | ORAL | 1 refills | Status: DC

## 2024-03-08 ENCOUNTER — Other Ambulatory Visit: Payer: Self-pay | Admitting: Internal Medicine

## 2024-03-08 NOTE — Telephone Encounter (Signed)
 Requested Prescriptions  Pending Prescriptions Disp Refills   lovastatin  (MEVACOR ) 10 MG tablet [Pharmacy Med Name: LOVASTATIN  10MG  TABLETS] 90 tablet 1    Sig: TAKE 1 TABLET(10 MG) BY MOUTH AT BEDTIME     Cardiovascular:  Antilipid - Statins 2 Failed - 03/08/2024  4:06 PM      Failed - Lipid Panel in normal range within the last 12 months    Cholesterol, Total  Date Value Ref Range Status  09/05/2023 204 (H) 100 - 199 mg/dL Final   LDL Chol Calc (NIH)  Date Value Ref Range Status  09/05/2023 131 (H) 0 - 99 mg/dL Final   HDL  Date Value Ref Range Status  09/05/2023 58 >39 mg/dL Final   Triglycerides  Date Value Ref Range Status  09/05/2023 85 0 - 149 mg/dL Final         Passed - Cr in normal range and within 360 days    Creatinine, Ser  Date Value Ref Range Status  09/05/2023 0.83 0.76 - 1.27 mg/dL Final         Passed - Patient is not pregnant      Passed - Valid encounter within last 12 months    Recent Outpatient Visits           6 months ago Mixed hyperlipidemia   [place] [facility] [place], [place], [place]

## 2024-03-11 ENCOUNTER — Encounter: Payer: Self-pay | Admitting: Internal Medicine

## 2024-03-11 ENCOUNTER — Ambulatory Visit: Admitting: Internal Medicine

## 2024-03-11 VITALS — BP 122/84 | Ht 70.0 in | Wt 257.6 lb

## 2024-03-11 DIAGNOSIS — Z0001 Encounter for general adult medical examination with abnormal findings: Secondary | ICD-10-CM | POA: Diagnosis not present

## 2024-03-11 DIAGNOSIS — Z125 Encounter for screening for malignant neoplasm of prostate: Secondary | ICD-10-CM

## 2024-03-11 DIAGNOSIS — R739 Hyperglycemia, unspecified: Secondary | ICD-10-CM

## 2024-03-11 DIAGNOSIS — Z23 Encounter for immunization: Secondary | ICD-10-CM | POA: Diagnosis not present

## 2024-03-11 DIAGNOSIS — Z6836 Body mass index (BMI) 36.0-36.9, adult: Secondary | ICD-10-CM

## 2024-03-11 DIAGNOSIS — E782 Mixed hyperlipidemia: Secondary | ICD-10-CM

## 2024-03-11 DIAGNOSIS — E66812 Obesity, class 2: Secondary | ICD-10-CM

## 2024-03-11 NOTE — Patient Instructions (Signed)
 Health Maintenance, Male  Adopting a healthy lifestyle and getting preventive care are important in promoting health and wellness. Ask your health care provider about:  The right schedule for you to have regular tests and exams.  Things you can do on your own to prevent diseases and keep yourself healthy.  What should I know about diet, weight, and exercise?  Eat a healthy diet    Eat a diet that includes plenty of vegetables, fruits, low-fat dairy products, and lean protein.  Do not eat a lot of foods that are high in solid fats, added sugars, or sodium.  Maintain a healthy weight  Body mass index (BMI) is a measurement that can be used to identify possible weight problems. It estimates body fat based on height and weight. Your health care provider can help determine your BMI and help you achieve or maintain a healthy weight.  Get regular exercise  Get regular exercise. This is one of the most important things you can do for your health. Most adults should:  Exercise for at least 150 minutes each week. The exercise should increase your heart rate and make you sweat (moderate-intensity exercise).  Do strengthening exercises at least twice a week. This is in addition to the moderate-intensity exercise.  Spend less time sitting. Even light physical activity can be beneficial.  Watch cholesterol and blood lipids  Have your blood tested for lipids and cholesterol at 56 years of age, then have this test every 5 years.  You may need to have your cholesterol levels checked more often if:  Your lipid or cholesterol levels are high.  You are older than 56 years of age.  You are at high risk for heart disease.  What should I know about cancer screening?  Many types of cancers can be detected early and may often be prevented. Depending on your health history and family history, you may need to have cancer screening at various ages. This may include screening for:  Colorectal cancer.  Prostate cancer.  Skin cancer.  Lung  cancer.  What should I know about heart disease, diabetes, and high blood pressure?  Blood pressure and heart disease  High blood pressure causes heart disease and increases the risk of stroke. This is more likely to develop in people who have high blood pressure readings or are overweight.  Talk with your health care provider about your target blood pressure readings.  Have your blood pressure checked:  Every 3-5 years if you are 24-52 years of age.  Every year if you are 3 years old or older.  If you are between the ages of 60 and 72 and are a current or former smoker, ask your health care provider if you should have a one-time screening for abdominal aortic aneurysm (AAA).  Diabetes  Have regular diabetes screenings. This checks your fasting blood sugar level. Have the screening done:  Once every three years after age 66 if you are at a normal weight and have a low risk for diabetes.  More often and at a younger age if you are overweight or have a high risk for diabetes.  What should I know about preventing infection?  Hepatitis B  If you have a higher risk for hepatitis B, you should be screened for this virus. Talk with your health care provider to find out if you are at risk for hepatitis B infection.  Hepatitis C  Blood testing is recommended for:  Everyone born from [birth year] through 1963.  Anyone  with known risk factors for hepatitis C.  Sexually transmitted infections (STIs)  You should be screened each year for STIs, including gonorrhea and chlamydia, if:  You are sexually active and are younger than 56 years of age.  You are older than 56 years of age and your health care provider tells you that you are at risk for this type of infection.  Your sexual activity has changed since you were last screened, and you are at increased risk for chlamydia or gonorrhea. Ask your health care provider if you are at risk.  Ask your health care provider about whether you are at high risk for HIV. Your health care provider  may recommend a prescription medicine to help prevent HIV infection. If you choose to take medicine to prevent HIV, you should first get tested for HIV. You should then be tested every 3 months for as long as you are taking the medicine.  Follow these instructions at home:  Alcohol use  Do not drink alcohol if your health care provider tells you not to drink.  If you drink alcohol:  Limit how much you have to 0-2 drinks a day.  Know how much alcohol is in your drink. In the [place] one drink equals one 12 oz bottle of beer (355 mL), one 5 oz glass of wine (148 mL), or one 1 oz glass of hard liquor (44 mL).  Lifestyle  Do not use any products that contain nicotine or tobacco. These products include cigarettes, chewing tobacco, and vaping devices, such as e-cigarettes. If you need help quitting, ask your health care provider.  Do not use street drugs.  Do not share needles.  Ask your health care provider for help if you need support or information about quitting drugs.  General instructions  Schedule regular health, dental, and eye exams.  Stay current with your vaccines.  Tell your health care provider if:  You often feel depressed.  You have ever been abused or do not feel safe at home.  Summary  Adopting a healthy lifestyle and getting preventive care are important in promoting health and wellness.  Follow your health care provider's instructions about healthy diet, exercising, and getting tested or screened for diseases.  Follow your health care provider's instructions on monitoring your cholesterol and blood pressure.  This information is not intended to replace advice given to you by your health care provider. Make sure you discuss any questions you have with your health care provider.  Document Revised: 10/12/2020 Document Reviewed: 10/12/2020  Elsevier Patient Education  2024 [employer].

## 2024-03-11 NOTE — Assessment & Plan Note (Signed)
 Encouraged diet and exercise for weight loss ?

## 2024-03-11 NOTE — Progress Notes (Signed)
 Subjective:    Patient ID: Luis Perez, male    DOB: 05-21-1966, 58 y.o.   MRN: [id number]  HPI  Patient presents to clinic today for his annual exam.  Flu: 03/2023 Tetanus: unsure COVID: X 4 Shingrix: Never PSA screening: 03/2023 Colon screening: 04/2023 Vision screening: annually Dentist: biannually  Diet: He does eat meat. He consumes fruits and veggies. He does eat some fried foods. He drinks mostly water. Exercise: Walking  Review of Systems     Past Medical History:  Diagnosis Date   Chicken pox    GERD (gastroesophageal reflux disease)    Sleep apnea    CPAP use    Current Outpatient Medications  Medication Sig Dispense Refill   lovastatin  (MEVACOR ) 10 MG tablet TAKE 1 TABLET(10 MG) BY MOUTH AT BEDTIME 90 tablet 1   omeprazole  (PRILOSEC) 40 MG capsule TAKE 1 CAPSULE(40 MG) BY MOUTH DAILY 90 capsule 1   tamsulosin  (FLOMAX ) 0.4 MG CAPS capsule TAKE 1 CAPSULE(0.4 MG) BY MOUTH DAILY 90 capsule 1   No current facility-administered medications for this visit.    Allergies  Allergen Reactions   Allegra-D [Fexofenadine-Pseudoephed Er] Swelling    Swelling of the eyes    Family History  Problem Relation Age of Onset   Arthritis Mother    Heart disease Brother        HCM   Arthritis Maternal Grandfather    Cancer Neg Hx    Diabetes Neg Hx    Stroke Neg Hx    Esophageal cancer Neg Hx    Rectal cancer Neg Hx    Stomach cancer Neg Hx    Colon cancer Neg Hx    Colon polyps Neg Hx     Social History   Socioeconomic History   Marital status: Married    Spouse name: Not on file   Number of children: Not on file   Years of education: Not on file   Highest education level: Associate degree: occupational, Scientist, product/process development, or vocational program  Occupational History   Not on file  Tobacco Use   Smoking status: Never   Smokeless tobacco: Never  Vaping Use   Vaping status: Never Used  Substance and Sexual Activity   Alcohol use: Yes    Alcohol/week: 20.0  standard drinks of alcohol    Types: 20 Cans of beer per week    Comment: moderate   Drug use: No   Sexual activity: Yes  Other Topics Concern   Not on file  Social History Narrative   Not on file   Social Drivers of Health   Financial Resource Strain: Low Risk  (09/04/2023)   Overall Financial Resource Strain (CARDIA)    Difficulty of Paying Living Expenses: Not hard at all  Food Insecurity: No Food Insecurity (09/04/2023)   Hunger Vital Sign    Worried About Running Out of Food in the Last Year: Never true    Ran Out of Food in the Last Year: Never true  Transportation Needs: No Transportation Needs (09/04/2023)   PRAPARE - Administrator, Civil Service (Medical): No    Lack of Transportation (Non-Medical): No  Physical Activity: Insufficiently Active (09/04/2023)   Exercise Vital Sign    Days of Exercise per Week: 2 days    Minutes of Exercise per Session: 20 min  Stress: No Stress Concern Present (09/04/2023)   [employer] of Occupational Health - Occupational Stress Questionnaire    Feeling of Stress : Not at all  Social  Connections: Moderately Integrated (09/04/2023)   Social Connection and Isolation Panel    Frequency of Communication with Friends and Family: Twice a week    Frequency of Social Gatherings with Friends and Family: Once a week    Attends Religious Services: More than 4 times per year    Active Member of [employer] or Organizations: No    Attends Engineer, structural: Not on file    Marital Status: Married  Catering manager Violence: Not on file     Constitutional: Denies fever, malaise, fatigue, headache or abrupt weight changes.  HEENT: Denies eye pain, eye redness, ear pain, ringing in the ears, wax buildup, runny nose, nasal congestion, bloody nose, or sore throat. Respiratory: Denies difficulty breathing, shortness of breath, cough or sputum production.   Cardiovascular: Denies chest pain, chest tightness, palpitations or swelling  in the hands or feet.  Gastrointestinal: Denies abdominal pain, bloating, constipation, diarrhea or blood in the stool.  GU: Patient reports erectile dysfunction.  Denies urgency, frequency, pain with urination, burning sensation, blood in urine, odor or discharge. Musculoskeletal: Denies decrease in range of motion, difficulty with gait, muscle pain or joint pain and swelling.  Skin: Denies redness, rashes, lesions or ulcercations.  Neurological: Denies dizziness, difficulty with memory, difficulty with speech or problems with balance and coordination.  Psych: Denies anxiety, depression, SI/HI.  No other specific complaints in a complete review of systems (except as listed in HPI above).  Objective:   Physical Exam   BP 122/84 (BP Location: Left Arm, Patient Position: Sitting, Cuff Size: Large)   Ht 5' 10 (1.778 m)   Wt 257 lb 9.6 oz (116.8 kg)   BMI 36.96 kg/m    Wt Readings from Last 3 Encounters:  09/05/23 249 lb 3.2 oz (113 kg)  04/11/23 248 lb (112.5 kg)  03/07/23 254 lb (115.2 kg)    General: Appears his stated age, obese, in NAD. Skin: Warm, dry and intact.  HEENT: Head: normal shape and size; Eyes: sclera white, no icterus, conjunctiva pink, PERRLA and EOMs intact;  Neck:  Neck supple, trachea midline. No masses, lumps or thyromegaly present.  Cardiovascular: Normal rate and rhythm. S1,S2 noted.  No murmur, rubs or gallops noted. No JVD or BLE edema. No carotid bruits noted. Pulmonary/Chest: Normal effort and positive vesicular breath sounds. No respiratory distress. No wheezes, rales or ronchi noted.  Abdomen: Soft and nontender. Normal bowel sounds.  Musculoskeletal: Strength 5/5 BUE/BLE. No difficulty with gait.  Neurological: Alert and oriented. Cranial nerves II-XII grossly intact. Coordination normal.  Psychiatric: Mood and affect normal. Behavior is normal. Judgment and thought content normal.    BMET    Component Value Date/Time   NA 138 09/05/2023 1027    K 4.1 09/05/2023 1027   CL 104 09/05/2023 1027   CO2 22 09/05/2023 1027   GLUCOSE 94 09/05/2023 1027   GLUCOSE 131 (H) 11/16/2020 1244   BUN 11 09/05/2023 1027   CREATININE 0.83 09/05/2023 1027   CALCIUM 9.4 09/05/2023 1027   GFRNONAA >60 11/16/2020 1244   GFRAA 118 11/13/2019 1635    Lipid Panel     Component Value Date/Time   CHOL 204 (H) 09/05/2023 1027   TRIG 85 09/05/2023 1027   HDL 58 09/05/2023 1027   CHOLHDL 3.5 09/05/2023 1027   LDLCALC 131 (H) 09/05/2023 1027    CBC    Component Value Date/Time   WBC 4.3 09/05/2023 1027   WBC 9.8 11/16/2020 1141   RBC 5.10 09/05/2023 1027  RBC 5.08 11/16/2020 1141   HGB 16.1 09/05/2023 1027   HCT 47.1 09/05/2023 1027   PLT 227 09/05/2023 1027   MCV 92 09/05/2023 1027   MCH 31.6 09/05/2023 1027   MCH 32.5 11/16/2020 1141   MCHC 34.2 09/05/2023 1027   MCHC 35.9 11/16/2020 1141   RDW 12.7 09/05/2023 1027    Hgb A1C Lab Results  Component Value Date   HGBA1C 5.2 09/05/2023           Assessment & Plan:   Preventative health maintenance:  Flu shot today Tetanus declined Encouraged him to get his COVID booster Discussed Shingrix vaccine, he will check coverage with his insurance company schedule visit he would like to have this done Colon screening UTD Encourage him to consume a balanced diet and exercise regimen Advised him to see an eye doctor and dentist annually We will check CBC, c-Met, lipid, A1c and PSA today    RTC in 6 months, follow-up chronic conditions Angeline Laura, NP

## 2024-04-01 ENCOUNTER — Other Ambulatory Visit: Payer: Self-pay | Admitting: Internal Medicine

## 2024-04-02 NOTE — Telephone Encounter (Signed)
 Requested Prescriptions  Pending Prescriptions Disp Refills   omeprazole  (PRILOSEC) 40 MG capsule [Pharmacy Med Name: OMEPRAZOLE  40MG  CAPSULES] 90 capsule 0    Sig: Take 1 capsule (40 mg total) by mouth as needed. TAKE 1 CAPSULE(40 MG) BY MOUTH DAILY     Gastroenterology: Proton Pump Inhibitors Passed - 04/02/2024  1:56 PM      Passed - Valid encounter within last 12 months    Recent Outpatient Visits           3 weeks ago Influenza vaccine administered   [place] [facility] [place], Angeline ORN, NP   7 months ago Mixed hyperlipidemia   [facility] [place], Angeline ORN, [place]

## 2024-04-05 ENCOUNTER — Ambulatory Visit: Payer: Self-pay | Admitting: Internal Medicine

## 2024-04-05 LAB — CBC
Hematocrit: 50.3 % (ref 37.5–51.0)
Hemoglobin: 16.5 g/dL (ref 13.0–17.7)
MCH: 31.9 pg (ref 26.6–33.0)
MCHC: 32.8 g/dL (ref 31.5–35.7)
MCV: 97 fL (ref 79–97)
Platelets: 254 x10E3/uL (ref 150–450)
RBC: 5.17 x10E6/uL (ref 4.14–5.80)
RDW: 12.8 % (ref 11.6–15.4)
WBC: 5.3 x10E3/uL (ref 3.4–10.8)

## 2024-04-05 LAB — COMPREHENSIVE METABOLIC PANEL WITH GFR
ALT: 34 IU/L (ref 0–44)
AST: 26 IU/L (ref 0–40)
Albumin: 4.5 g/dL (ref 3.8–4.9)
Alkaline Phosphatase: 80 IU/L (ref 47–123)
BUN/Creatinine Ratio: 12 (ref 9–20)
BUN: 10 mg/dL (ref 6–24)
Bilirubin Total: 0.6 mg/dL (ref 0.0–1.2)
CO2: 22 mmol/L (ref 20–29)
Calcium: 9.5 mg/dL (ref 8.7–10.2)
Chloride: 103 mmol/L (ref 96–106)
Creatinine, Ser: 0.83 mg/dL (ref 0.76–1.27)
Globulin, Total: 2.5 g/dL (ref 1.5–4.5)
Glucose: 101 mg/dL — ABNORMAL HIGH (ref 70–99)
Potassium: 5 mmol/L (ref 3.5–5.2)
Sodium: 141 mmol/L (ref 134–144)
Total Protein: 7 g/dL (ref 6.0–8.5)
eGFR: 103 mL/min/1.73 (ref >59)

## 2024-04-05 LAB — LIPID PANEL
Chol/HDL Ratio: 2.4 ratio (ref 0.0–5.0)
Cholesterol, Total: 157 mg/dL (ref 100–199)
HDL: 66 mg/dL (ref >39)
LDL Chol Calc (NIH): 78 mg/dL (ref 0–99)
Triglycerides: 64 mg/dL (ref 0–149)
VLDL Cholesterol Cal: 13 mg/dL (ref 5–40)

## 2024-04-05 LAB — HEMOGLOBIN A1C
Est. average glucose Bld gHb Est-mCnc: 103 mg/dL
Hgb A1c MFr Bld: 5.2 % (ref 4.8–5.6)

## 2024-04-05 LAB — PSA: Prostate Specific Ag, Serum: 1.9 ng/mL (ref 0.0–4.0)

## 2024-06-04 ENCOUNTER — Other Ambulatory Visit: Payer: Self-pay | Admitting: Internal Medicine

## 2024-06-05 NOTE — Telephone Encounter (Signed)
 Requested by interface surescripts. Future visit 09/12/24. Requested Prescriptions  Pending Prescriptions Disp Refills   tamsulosin  (FLOMAX ) 0.4 MG CAPS capsule [Pharmacy Med Name: TAMSULOSIN  0.4MG  CAPSULES] 90 capsule 0    Sig: TAKE 1 CAPSULE(0.4 MG) BY MOUTH DAILY     Urology: Alpha-Adrenergic Blocker Passed - 06/05/2024 12:40 PM      Passed - PSA in normal range and within 360 days    Prostate Specific Ag, Serum  Date Value Ref Range Status  04/04/2024 1.9 0.0 - 4.0 ng/mL Final    Comment:    Roche ECLIA methodology. According to the American Urological Association, Serum PSA should decrease and remain at undetectable levels after radical prostatectomy. The AUA defines biochemical recurrence as an initial PSA value 0.2 ng/mL or greater followed by a subsequent confirmatory PSA value 0.2 ng/mL or greater. Values obtained with different assay methods or kits cannot be used interchangeably. Results cannot be interpreted as absolute evidence of the presence or absence of malignant disease.          Passed - Last BP in normal range    BP Readings from Last 1 Encounters:  03/11/24 122/84         Passed - Valid encounter within last 12 months    Recent Outpatient Visits           2 months ago Influenza vaccine administered   [place] [facility] [place], Angeline ORN, NP   9 months ago Mixed hyperlipidemia   [facility] [place], Angeline ORN, [place]

## 2024-09-12 ENCOUNTER — Ambulatory Visit: Admitting: Internal Medicine
